# Patient Record
Sex: Male | Born: 1975 | Race: White | Hispanic: No | Marital: Married | Smoking: Former smoker
Health system: Southern US, Community
[De-identification: ages and names within clinical notes are randomized; demographics above are authoritative.]

## PROBLEM LIST (undated history)

## (undated) DIAGNOSIS — M75101 Unspecified rotator cuff tear or rupture of right shoulder, not specified as traumatic: Secondary | ICD-10-CM

## (undated) DIAGNOSIS — E7800 Pure hypercholesterolemia, unspecified: Secondary | ICD-10-CM

## (undated) DIAGNOSIS — K219 Gastro-esophageal reflux disease without esophagitis: Secondary | ICD-10-CM

## (undated) HISTORY — DX: Pure hypercholesterolemia, unspecified: E78.00

## (undated) HISTORY — DX: Gastro-esophageal reflux disease without esophagitis: K21.9

## (undated) HISTORY — PX: VASECTOMY: SHX75

---

## 2010-05-31 ENCOUNTER — Ambulatory Visit (HOSPITAL_COMMUNITY)
Admission: RE | Admit: 2010-05-31 | Discharge: 2010-05-31 | Payer: Self-pay | Source: Home / Self Care | Attending: Chiropractic Medicine | Admitting: Chiropractic Medicine

## 2010-06-23 HISTORY — PX: LUMBAR LAMINECTOMY: SHX95

## 2010-06-24 ENCOUNTER — Observation Stay (HOSPITAL_COMMUNITY)
Admission: RE | Admit: 2010-06-24 | Discharge: 2010-06-25 | Disposition: A | Discharge status: Home / Self Care | Payer: No Typology Code available for payment source | Source: Ambulatory Visit | Attending: Specialist | Admitting: Specialist

## 2010-06-24 ENCOUNTER — Ambulatory Visit (HOSPITAL_COMMUNITY): Payer: No Typology Code available for payment source

## 2010-06-24 DIAGNOSIS — K219 Gastro-esophageal reflux disease without esophagitis: Secondary | ICD-10-CM | POA: Insufficient documentation

## 2010-06-24 DIAGNOSIS — M5126 Other intervertebral disc displacement, lumbar region: Principal | ICD-10-CM | POA: Insufficient documentation

## 2010-06-24 LAB — URINALYSIS, ROUTINE W REFLEX MICROSCOPIC
Bilirubin Urine: NEGATIVE
Ketones, ur: NEGATIVE mg/dL
Nitrite: NEGATIVE
Specific Gravity, Urine: 1.02 (ref 1.005–1.030)
Urobilinogen, UA: 0.2 mg/dL (ref 0.0–1.0)

## 2010-06-24 LAB — COMPREHENSIVE METABOLIC PANEL
Alkaline Phosphatase: 37 U/L — ABNORMAL LOW (ref 39–117)
BUN: 9 mg/dL (ref 6–23)
CO2: 28 mEq/L (ref 19–32)
Chloride: 104 mEq/L (ref 96–112)
GFR calc non Af Amer: >60 mL/min (ref >60)
Glucose, Bld: 96 mg/dL (ref 70–99)
Potassium: 3.9 mEq/L (ref 3.5–5.1)
Total Bilirubin: 1.1 mg/dL (ref 0.3–1.2)

## 2010-06-24 LAB — CBC
MCH: 31.3 pg (ref 26.0–34.0)
MCV: 88.4 fL (ref 78.0–100.0)
Platelets: 212 10*3/uL (ref 150–400)
RDW: 12.4 % (ref 11.5–15.5)
WBC: 4.2 10*3/uL (ref 4.0–10.5)

## 2010-07-03 NOTE — Op Note (Signed)
NAMEGILLIE, Richard Spencer               ACCOUNT NO.:  [id number]  MEDICAL RECORD NO.:  [id number]           PATIENT TYPE:  O  LOCATION:  DAYL                         FACILITY:  [facility]  PHYSICIAN:  Jene Every, M.D.    DATE OF BIRTH:  06/10/1975  DATE OF PROCEDURE: DATE OF DISCHARGE:                              OPERATIVE REPORT   PREOPERATIVE DIAGNOSIS:  Spinal stenosis, herniated nucleus pulposus L4- 5 left.  POSTOPERATIVE DIAGNOSIS:  Spinal stenosis, herniated nucleus pulposus L4- 5 left.  PROCEDURES PERFORMED: 1. Lateral recess decompression L4-5. 2. Microdiskectomy L4-5. 3. Foraminotomy L5.  ANESTHESIA:  General.  ASSISTANT:  Roma Schanz, P.A.  BRIEF HISTORY:  A 35 year old with left lower extremity radicular pain, disk herniation compressing the 5 root indicated for decompression of the 5 root.  Risks and benefits discussed including bleeding, infection, damage to vascular structures, no change in symptoms, worsening symptoms, need for repeat debridement, DVT, PE, anesthetic complications, etc.  TECHNIQUE:  Placed in supine position.  After induction of adequate anesthesia and 1 gram Kefzol, he was placed prone on the [place] frame. All bony prominences were well-padded.  Lumbar region was prepped and draped in the usual sterile fashion.  An 18-gauge spinal needle was utilized to localize the L4-5 interspace, confirmed with x-ray. Incision was made from the spinous process of 4 to 5.  Subcutaneous tissue was dissected.  Electrocautery was utilized to achieve hemostasis.  Dorsolumbar fascia identified and divided in line with skin incision.  Paraspinous muscle elevated from lamina of 4 and 5. Operating microscope draped and brought onto the surgical field. Penfield 4 placed in the interlaminar space confirming 4-5 by x-ray.  Next, straight curette utilized to detach the ligamentum flavum from the cephalad edge of 5.  Hemilaminotomy at the caudad edge of 4  was performed with a 3-mm Kerrison, detaching the ligamentum flavum and preserving the pars.  Neural patty placed beneath the neural elements. Ligamentum flavum removed from the interspace.  A large disk herniation focal compressing the 5 root was noted.  We gently mobilized the 5 root medially after a foraminotomy of 5 was performed.  Focal HNP was noted. Annulotomy performed and a large extruded fragment was expressed.  Three fragments were removed with the micropituitary.  We then entered the disk space and performed diskectomy of herniated material.  We used a nerve hook to mobilized 2 further small fragments.  Following this, there was at least a centimeter of excursion of the 5 root medial to the pedicle without tension.  I swept beneath the thecal sac foramen of 5-4 shoulder of the root.  No evidence of residual disk herniation compressing the 5 root.  Disk space copiously irrigated with antibiotic irrigation.  Inspection revealed no evidence of CSF leakage or active bleeding.  Epidural fat was draped across and around the 5 nerve root.  Final x-ray was unavailable due to the unavailability of the digital portable x-ray machine, which was nonfunctional today.  I felt, given the localization noted previously and the large HNP noted consistent with that seen on the MRI, this was not necessary at this point.  Next, the  dorsolumbar fascia reapproximated with 0 Vicryl interrupted figure-of-8 sutures.  Subcutaneous with 2-0 Vicryl simple sutures.  Skin was reapproximated with 4-0 subcuticular Prolene.  The wound was reinforced with Steri-Strips.  Sterile dressing was applied.  Placed supine on the hospital bed, extubated without difficulty and transported to the recovery room in satisfactory condition.  The patient tolerated the procedure well.  There were no complications.  ESTIMATED BLOOD LOSS:  Minimal.     Jene Every, M.D.     Cordelia Pen  D:  06/24/2010  T:  06/24/2010   Job:  [id number]  Electronically Signed by Jene Every M.D. on 06/28/2010 02:19:35 PM

## 2010-09-27 NOTE — Discharge Summary (Signed)
  NAMEJALIK, Richard Spencer               ACCOUNT NO.:  [id number]  MEDICAL RECORD NO.:  [id number]           PATIENT TYPE:  I  LOCATION:  1609                         FACILITY:  [facility]  PHYSICIAN:  Jene Every, M.D.    DATE OF BIRTH:  April 03, 1976  DATE OF ADMISSION:  06/24/2010 DATE OF DISCHARGE:  06/25/2010                              DISCHARGE SUMMARY   ADMISSION DIAGNOSIS:  Spinal stenosis and herniated nucleus pulposus, L4- L5 on the left.  DISCHARGE DIAGNOSIS:  Spinal stenosis and herniated nucleus pulposus, L4- L5 on the left, status post lumbar decompression and microdiskectomy at L4-L5.  HOSPITAL COURSE:  Uncomplicated.  DISPOSITION:  The patient stable to be discharged home on postop day #1. He is to follow up with Dr. Shelle Iron in approximately 10-14 days from his surgery.  ACTIVITIES:  To ambulate as tolerated utilizing back precautions.  DIET:  As tolerated.  CONDITION ON DISCHARGE:  Stable.  MEDICATIONS:  As per med rec sheet.  FINAL DIAGNOSIS:  Doing well status post lumbar decompression at L4-L5 on the left.     Roma Schanz, P.A.   ______________________________ Jene Every, M.D.    CS/MEDQ  D:  09/07/2010  T:  09/08/2010  Job:  [id number]  Electronically Signed by Roma Schanz P.A. on 09/14/2010 05:53:31 PM Electronically Signed by Jene Every M.D. on 09/27/2010 02:23:17 PM

## 2011-08-23 ENCOUNTER — Encounter: Payer: Self-pay | Admitting: Gastroenterology

## 2011-09-16 ENCOUNTER — Ambulatory Visit: Payer: No Typology Code available for payment source | Admitting: Internal Medicine

## 2011-09-19 ENCOUNTER — Encounter: Payer: Self-pay | Admitting: Gastroenterology

## 2011-09-19 ENCOUNTER — Ambulatory Visit (INDEPENDENT_AMBULATORY_CARE_PROVIDER_SITE_OTHER): Payer: No Typology Code available for payment source | Admitting: Gastroenterology

## 2011-09-19 ENCOUNTER — Other Ambulatory Visit (INDEPENDENT_AMBULATORY_CARE_PROVIDER_SITE_OTHER): Payer: No Typology Code available for payment source

## 2011-09-19 VITALS — BP 110/68 | HR 68 | Ht 66.0 in | Wt 150.0 lb

## 2011-09-19 DIAGNOSIS — R1013 Epigastric pain: Secondary | ICD-10-CM

## 2011-09-19 DIAGNOSIS — R079 Chest pain, unspecified: Secondary | ICD-10-CM

## 2011-09-19 LAB — CBC WITH DIFFERENTIAL/PLATELET
Basophils Relative: 0.4 % (ref 0.0–3.0)
Eosinophils Absolute: 0.1 10*3/uL (ref 0.0–0.7)
MCHC: 34.5 g/dL (ref 30.0–36.0)
MCV: 92.3 fl (ref 78.0–100.0)
Monocytes Absolute: 0.4 10*3/uL (ref 0.1–1.0)
Neutrophils Relative %: 63.2 % (ref 43.0–77.0)
RBC: 4.63 Mil/uL (ref 4.22–5.81)

## 2011-09-19 LAB — COMPREHENSIVE METABOLIC PANEL
ALT: 27 U/L (ref 0–53)
AST: 22 U/L (ref 0–37)
Albumin: 4 g/dL (ref 3.5–5.2)
Alkaline Phosphatase: 35 U/L — ABNORMAL LOW (ref 39–117)
BUN: 12 mg/dL (ref 6–23)
Potassium: 4 mEq/L (ref 3.5–5.1)
Sodium: 142 mEq/L (ref 135–145)
Total Protein: 6.6 g/dL (ref 6.0–8.3)

## 2011-09-19 NOTE — Progress Notes (Signed)
History of Present Illness: This is a 36 year old male who relates problems with recurrent right upper quadrant and epigastric pain for the past few months. His symptoms are episodic. One episode in early May was quite severe pain in the right upper quadrant epigastrium right lower chest and left shoulder. And lasted for 8 or 9 hours. He has had mild episodes of epigastric right upper quadrant and right lower chest pain some associated with gas and bloating since then. He has had long-term GERD treated with medications since the 1990s. He is currently treated with omeprazole 20 mg daily. Denies weight loss, constipation, diarrhea, change in stool caliber, melena, hematochezia, nausea, vomiting, dysphagia  Current Medications, Allergies, Past Medical History, Past Surgical History, Family History and Social History were reviewed in [employer] record.  Physical Exam: General: Well developed , well nourished, no acute distress Head: Normocephalic and atraumatic Eyes:  sclerae anicteric, EOMI Ears: Normal auditory acuity Mouth: No deformity or lesions Lungs: Clear throughout to auscultation, no chest wall tenderness Heart: Regular rate and rhythm; no murmurs, rubs or bruits Abdomen: Soft, non tender and non distended. No masses, hepatosplenomegaly or hernias noted. Normal Bowel sounds Musculoskeletal: Symmetrical with no gross deformities  Pulses:  Normal pulses noted Extremities: No clubbing, cyanosis, edema or deformities noted Neurological: Alert oriented x 4, grossly nonfocal Psychological:  Alert and cooperative. Normal mood and affect  Assessment and Recommendations:  1. Episodic epigastric, right upper quadrant and right lower chest pain. One episode was associated with left shoulder pain. Rule out GERD, gastritis, ulcer disease, cholelithiasis. Obtain blood work and schedule an abdominal ultrasound. If no diagnosis is established will proceed with upper endoscopy. The  risks, benefits, and alternatives to endoscopy with possible biopsy and possible dilation were discussed with the patient and they consent to proceed.

## 2011-09-19 NOTE — Patient Instructions (Addendum)
You have been given a separate informational sheet regarding your tobacco use, the importance of quitting and local resources to help you quit.  Your physician has requested that you go to the basement for the following lab work before leaving today:CBC, Cmet.  You have been scheduled for an abdominal ultrasound at [facility] (1st floor of hospital) on 09/21/11 at 8:30am. Please arrive 15 minutes prior to your appointment for registration. Make certain not to have anything to eat or drink 6 hours prior to your appointment. Should you need to reschedule your appointment, please contact radiology at [phone].  cc: Blanche East, MD

## 2011-09-21 ENCOUNTER — Ambulatory Visit (HOSPITAL_COMMUNITY)
Admission: RE | Admit: 2011-09-21 | Discharge: 2011-09-21 | Disposition: A | Discharge status: Home / Self Care | Payer: No Typology Code available for payment source | Source: Ambulatory Visit | Attending: Gastroenterology | Admitting: Gastroenterology

## 2011-09-21 DIAGNOSIS — R079 Chest pain, unspecified: Secondary | ICD-10-CM | POA: Insufficient documentation

## 2011-09-21 DIAGNOSIS — R1013 Epigastric pain: Secondary | ICD-10-CM | POA: Insufficient documentation

## 2011-10-24 ENCOUNTER — Encounter: Payer: Self-pay | Admitting: Gastroenterology

## 2011-10-24 ENCOUNTER — Ambulatory Visit (INDEPENDENT_AMBULATORY_CARE_PROVIDER_SITE_OTHER): Payer: No Typology Code available for payment source | Admitting: Gastroenterology

## 2011-10-24 VITALS — BP 110/60 | HR 88 | Ht 66.0 in | Wt 144.0 lb

## 2011-10-24 DIAGNOSIS — R109 Unspecified abdominal pain: Secondary | ICD-10-CM

## 2011-10-24 DIAGNOSIS — K219 Gastro-esophageal reflux disease without esophagitis: Secondary | ICD-10-CM

## 2011-10-24 NOTE — Patient Instructions (Signed)
You have been given an anti-Reflux handout.  Follow up as needed

## 2011-10-24 NOTE — Progress Notes (Signed)
History of Present Illness: This is a 36 year old male who had had resolution of his upper abd pain. [place] and blood work was all normal. He GERD is well controlled on daily omeprazole. He noted a few episodes on mild, brief duration lower abd pain, not associated with meals or BMs.  Current Medications, Allergies, Past Medical History, Past Surgical History, Family History and Social History were reviewed in [employer] record.  Physical Exam: General: Well developed , well nourished, no acute distress Head: Normocephalic and atraumatic Eyes:  sclerae anicteric, EOMI Ears: Normal auditory acuity Mouth: No deformity or lesions Lungs: Clear throughout to auscultation Heart: Regular rate and rhythm; no murmurs, rubs or bruits Abdomen: Soft, non tender and non distended. No masses, hepatosplenomegaly or hernias noted. Normal Bowel sounds Musculoskeletal: Symmetrical with no gross deformities  Extremities: No clubbing, cyanosis, edema or deformities noted Neurological: Alert oriented x 4, grossly nonfocal Psychological:  Alert and cooperative. Normal mood and affect  Assessment and Recommendations:  1. GERD. Continue omeprazole daily and standard antireflux measures.   2. Upper abd pain resolved. Possibly muscoloskeletal.  3. Mild lower abd pain. Monitor for now. If it worsens will try an anti-spasmotic and he is advised to return for follow up.

## 2013-06-18 ENCOUNTER — Ambulatory Visit: Payer: Self-pay | Admitting: Specialist

## 2013-07-25 ENCOUNTER — Other Ambulatory Visit: Payer: Self-pay | Admitting: Orthopedic Surgery

## 2013-07-26 ENCOUNTER — Other Ambulatory Visit: Payer: Self-pay | Admitting: Orthopedic Surgery

## 2013-07-26 NOTE — H&P (Signed)
Richard Spencer is an 38 y.o. male.   Chief Complaint: shoulder pain HPI: The patient is a 38 year old male who presents today for follow up of their shoulder. The patient is being followed for their bilateral shoulder pain. They are 2 year(s) out from when symptoms began. Symptoms reported today include: pain. and report their pain level to be moderate to severe. Current treatment includes: NSAIDs (Ibuprofen). The patient presents today following MRI.  Richard Spencer follows up with bilateral shoulder pain, both sides are fairly severe.  Past Medical History  Diagnosis Date  . Hypercholesterolemia   . Allergic rhinitis   . GERD (gastroesophageal reflux disease)     Past Surgical History  Procedure Laterality Date  . Lumbar laminectomy  06/2010    Family History  Problem Relation Age of Onset  . Prostate cancer Father   . Coronary artery disease Father   . Heart attack Father   . Heart disease Father   . Hypertension Mother   . Rheum arthritis Maternal Grandmother   . Colon cancer Neg Hx    Social History:  reports that he has never smoked. His smokeless tobacco use includes Snuff. He reports that he drinks alcohol. He reports that he does not use illicit drugs.  Allergies:  Allergies  Allergen Reactions  . Erythromycin Other (See Comments)    GI upset     (Not in a hospital admission)  No results found for this or any previous visit (from the past 48 hour(s)). No results found.  Review of Systems  Constitutional: Negative.   HENT: Negative.   Eyes: Negative.   Respiratory: Negative.   Cardiovascular: Negative.   Gastrointestinal: Negative.   Genitourinary: Negative.   Musculoskeletal: Positive for joint pain.  Skin: Negative.   Neurological: Negative.   Endo/Heme/Allergies: Negative.   Psychiatric/Behavioral: Negative.     There were no vitals taken for this visit. Physical Exam  Constitutional: He is oriented to person, place, and time. He appears  well-developed and well-nourished.  HENT:  Head: Normocephalic and atraumatic.  Eyes: Conjunctivae and EOM are normal. Pupils are equal, round, and reactive to light.  Neck: Normal range of motion. Neck supple.  Cardiovascular: Normal rate and regular rhythm.   Respiratory: Effort normal and breath sounds normal.  GI: Soft. Bowel sounds are normal.  Musculoskeletal:  Examination of the left, he has a positive impingement sign.  Inspection of the shoulder revealed no ecchymosis, soft tissue swelling, or deformity. On palpation, nontender over the [facility] and in the subacromial region. On range of motion the patient had full range of motion. Provocative signs indicated no sulcus sign, negative speed's test. Negative lift off. Sensory exam was intact and motor function was normal in the deltoid and the rotator cuff.  On the right, positive impingement sign, positive secondary impingement sign. Tender in the anterior subacromial region. Some weakness in abduction.  Inspection of the shoulder revealed no ecchymosis, soft tissue swelling, or deformity. On palpation, nontender over the [facility]. Provocative signs indicated no sulcus sign, negative speed's test. Negative lift off. Sensory exam was intact and motor function was normal in the deltoid and the rotator cuff.  Neurological: He is alert and oriented to person, place, and time. He has normal reflexes.  Skin: Skin is warm and dry.  Psychiatric: He has a normal mood and affect.    On the left MRI indicates some bursitis, tendonitis, rotator cuff arthropathy. No definitive rotator cuff tear.  On the right he has greater  than 50% interstitial tear of the supraspinatus at its insertion. Mild to moderate AC arthrosis, anterior and inferior glenoid labral tear, chronic, nontender.   Assessment/Plan R shoulder RCT Persistent shoulder pain secondary to partial tear rotator cuff greater than 50% thickness with underlying labral tear, not symptomatic.  Pain is mainly abduction, overhead activities. This has been refractory to rest, activity modifications, home exercise program, two years in duration.  Discussed options bilaterally, for the left shoulder arthroscopy, for the right arthroscopy, subacromial decompression, and repair of partial tear, it is greater than 50%, and with his continued symptoms that would be one option for him versus living with his symptoms and expected management. Discussed avoiding a full thickness tear and retracted tendon. The patient would like to proceed with the surgery. Discussed time out of work. 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 He should be able to accelerate his recovery. We will proceed accordingly.  I had a long discussion with the patient concerning risks and benefits of shoulder arthroscopy including no changes, worsening in symptoms. Also the need for manipulation of the extremity, need for open rotator cuff repair. Also discussed infection, DVT, PE, anesthetic complications, etc. Also included was the possibility of requirement for a repeat debridement in the future. Perioperative course was discussed in detail as well as time to recovery. An illustrated handout was provided and discussed in detail.  Plan Right shoulder arthroscopy, SAD, mini-open RCR  BISSELL, JACLYN M. 07/26/2013, 3:12 PM

## 2013-07-31 ENCOUNTER — Encounter (HOSPITAL_COMMUNITY): Payer: Self-pay | Admitting: Pharmacy Technician

## 2013-08-01 ENCOUNTER — Other Ambulatory Visit (HOSPITAL_COMMUNITY): Payer: Self-pay | Admitting: Specialist

## 2013-08-01 NOTE — Patient Instructions (Addendum)
      Your procedure is scheduled on:  08/08/13 THURSDAY  Report to [facility] at   0800    AM.  Call this number if you have problems the morning of surgery: [phone]        Do not eat food  Or drink :After Midnight. Wednesday NIGHT   Take these medicines the morning of surgery with A SIP OF WATER: OMEPRAZOLE   .  Contacts, dentures or partial plates, or metal hairpins  can not be worn to surgery. Your family will be responsible for glasses, dentures, hearing aides while you are in surgery  Leave suitcase in the car. After surgery it may be brought to your room.  For patients admitted to the hospital, checkout time is 11:00 AM day of  discharge.                DO NOT WEAR JEWELRY, LOTIONS, POWDERS, OR PERFUMES.  WOMEN-- DO NOT SHAVE LEGS OR UNDERARMS FOR 48 HOURS BEFORE SHOWERS. MEN MAY SHAVE FACE.  Patients discharged the day of surgery will not be allowed to drive home. IF going home the day of surgery, you must have a driver and someone to stay with you for the first 24 hours  Name and phone number of your driver: overnight stay-  [place] wife                                                                                           FAILURE TO [place]                                                  Patient Signature _____________________________

## 2013-08-05 ENCOUNTER — Encounter (HOSPITAL_COMMUNITY): Payer: Self-pay

## 2013-08-05 ENCOUNTER — Encounter (HOSPITAL_COMMUNITY)
Admission: RE | Admit: 2013-08-05 | Discharge: 2013-08-05 | Disposition: A | Discharge status: Home / Self Care | Payer: No Typology Code available for payment source | Source: Ambulatory Visit | Attending: Specialist | Admitting: Specialist

## 2013-08-05 HISTORY — DX: Unspecified rotator cuff tear or rupture of right shoulder, not specified as traumatic: M75.101

## 2013-08-05 LAB — CBC
HCT: 44.7 % (ref 39.0–52.0)
Hemoglobin: 15.8 g/dL (ref 13.0–17.0)
MCH: 31.8 pg (ref 26.0–34.0)
MCHC: 35.3 g/dL (ref 30.0–36.0)
MCV: 89.9 fL (ref 78.0–100.0)
Platelets: 259 10*3/uL (ref 150–400)
RBC: 4.97 MIL/uL (ref 4.22–5.81)
RDW: 12.7 % (ref 11.5–15.5)
WBC: 5.7 10*3/uL (ref 4.0–10.5)

## 2013-08-05 LAB — COMPREHENSIVE METABOLIC PANEL
ALT: 24 U/L (ref 0–53)
AST: 19 U/L (ref 0–37)
Albumin: 4.5 g/dL (ref 3.5–5.2)
Alkaline Phosphatase: 49 U/L (ref 39–117)
BILIRUBIN TOTAL: 0.6 mg/dL (ref 0.3–1.2)
BUN: 11 mg/dL (ref 6–23)
CHLORIDE: 98 meq/L (ref 96–112)
CO2: 28 meq/L (ref 19–32)
CREATININE: 1.11 mg/dL (ref 0.50–1.35)
Calcium: 10 mg/dL (ref 8.4–10.5)
GFR, EST NON AFRICAN AMERICAN: 82 mL/min — AB (ref >90)
GLUCOSE: 96 mg/dL (ref 70–99)
Potassium: 4.8 mEq/L (ref 3.7–5.3)
Sodium: 137 mEq/L (ref 137–147)
Total Protein: 7.6 g/dL (ref 6.0–8.3)

## 2013-08-05 NOTE — Progress Notes (Signed)
Instructed no alcohol at least 48 hrs pre op. Verbalized understanding

## 2013-08-05 NOTE — Progress Notes (Signed)
Instructed patient that NPO after midnight means NO SNUFF AS WELL. Stated understood this

## 2013-08-08 ENCOUNTER — Ambulatory Visit (HOSPITAL_COMMUNITY): Payer: No Typology Code available for payment source | Admitting: Anesthesiology

## 2013-08-08 ENCOUNTER — Encounter (HOSPITAL_COMMUNITY): Payer: No Typology Code available for payment source | Admitting: Anesthesiology

## 2013-08-08 ENCOUNTER — Ambulatory Visit (HOSPITAL_COMMUNITY)
Admission: RE | Admit: 2013-08-08 | Discharge: 2013-08-08 | Disposition: A | Discharge status: Home / Self Care | Payer: No Typology Code available for payment source | Source: Ambulatory Visit | Attending: Specialist | Admitting: Specialist

## 2013-08-08 ENCOUNTER — Encounter (HOSPITAL_COMMUNITY)
Admission: RE | Disposition: A | Discharge status: Home / Self Care | Payer: Self-pay | Source: Ambulatory Visit | Attending: Specialist

## 2013-08-08 ENCOUNTER — Encounter (HOSPITAL_COMMUNITY): Payer: Self-pay | Admitting: *Deleted

## 2013-08-08 DIAGNOSIS — M67919 Unspecified disorder of synovium and tendon, unspecified shoulder: Secondary | ICD-10-CM | POA: Insufficient documentation

## 2013-08-08 DIAGNOSIS — M7580 Other shoulder lesions, unspecified shoulder: Principal | ICD-10-CM

## 2013-08-08 DIAGNOSIS — Z5333 Arthroscopic surgical procedure converted to open procedure: Secondary | ICD-10-CM | POA: Insufficient documentation

## 2013-08-08 DIAGNOSIS — Z79899 Other long term (current) drug therapy: Secondary | ICD-10-CM | POA: Insufficient documentation

## 2013-08-08 DIAGNOSIS — X58XXXA Exposure to other specified factors, initial encounter: Secondary | ICD-10-CM | POA: Insufficient documentation

## 2013-08-08 DIAGNOSIS — J309 Allergic rhinitis, unspecified: Secondary | ICD-10-CM | POA: Insufficient documentation

## 2013-08-08 DIAGNOSIS — E7800 Pure hypercholesterolemia, unspecified: Secondary | ICD-10-CM | POA: Insufficient documentation

## 2013-08-08 DIAGNOSIS — Z87891 Personal history of nicotine dependence: Secondary | ICD-10-CM | POA: Insufficient documentation

## 2013-08-08 DIAGNOSIS — M719 Bursopathy, unspecified: Secondary | ICD-10-CM | POA: Insufficient documentation

## 2013-08-08 DIAGNOSIS — M898X9 Other specified disorders of bone, unspecified site: Secondary | ICD-10-CM | POA: Insufficient documentation

## 2013-08-08 DIAGNOSIS — M25819 Other specified joint disorders, unspecified shoulder: Secondary | ICD-10-CM | POA: Insufficient documentation

## 2013-08-08 DIAGNOSIS — M75101 Unspecified rotator cuff tear or rupture of right shoulder, not specified as traumatic: Secondary | ICD-10-CM

## 2013-08-08 DIAGNOSIS — S43429A Sprain of unspecified rotator cuff capsule, initial encounter: Secondary | ICD-10-CM | POA: Insufficient documentation

## 2013-08-08 DIAGNOSIS — M6580 Other synovitis and tenosynovitis, unspecified site: Secondary | ICD-10-CM | POA: Insufficient documentation

## 2013-08-08 DIAGNOSIS — K219 Gastro-esophageal reflux disease without esophagitis: Secondary | ICD-10-CM | POA: Insufficient documentation

## 2013-08-08 HISTORY — PX: SHOULDER ARTHROSCOPY WITH OPEN ROTATOR CUFF REPAIR: SHX6092

## 2013-08-08 SURGERY — ARTHROSCOPY, SHOULDER WITH REPAIR, ROTATOR CUFF, OPEN
Anesthesia: General | Site: Shoulder | Laterality: Right

## 2013-08-08 MED ORDER — ROCURONIUM BROMIDE 100 MG/10ML IV SOLN
INTRAVENOUS | Status: AC
  Filled 2013-08-08: qty 1

## 2013-08-08 MED ORDER — OMEPRAZOLE MAGNESIUM 20 MG PO TBEC: 20.0000 mg | DELAYED_RELEASE_TABLET | Freq: Every day | ORAL | Status: DC

## 2013-08-08 MED ORDER — METOCLOPRAMIDE HCL 5 MG/ML IJ SOLN: 5.0000 mg | Freq: Three times a day (TID) | INTRAMUSCULAR | Status: DC | PRN

## 2013-08-08 MED ORDER — PHENYLEPHRINE HCL 10 MG/ML IJ SOLN
INTRAMUSCULAR | Status: DC | PRN
  Administered 2013-08-08: 40 ug via INTRAVENOUS

## 2013-08-08 MED ORDER — ROCURONIUM BROMIDE 100 MG/10ML IV SOLN
INTRAVENOUS | Status: DC | PRN
  Administered 2013-08-08: 45 mg via INTRAVENOUS

## 2013-08-08 MED ORDER — ONDANSETRON HCL 4 MG/2ML IJ SOLN
INTRAMUSCULAR | Status: DC | PRN
  Administered 2013-08-08: 4 mg via INTRAVENOUS

## 2013-08-08 MED ORDER — PHENOL 1.4 % MT LIQD: 1.0000 | OROMUCOSAL | Status: DC | PRN

## 2013-08-08 MED ORDER — METHOCARBAMOL 500 MG PO TABS: 500.0000 mg | ORAL_TABLET | Freq: Four times a day (QID) | ORAL | Status: DC | PRN

## 2013-08-08 MED ORDER — ACETAMINOPHEN 325 MG PO TABS: 650.0000 mg | ORAL_TABLET | Freq: Four times a day (QID) | ORAL | Status: DC | PRN

## 2013-08-08 MED ORDER — SODIUM CHLORIDE 0.45 % IV SOLN: INTRAVENOUS | Status: DC

## 2013-08-08 MED ORDER — HYDROCODONE-ACETAMINOPHEN 5-325 MG PO TABS: 1.0000 | ORAL_TABLET | ORAL | Status: DC | PRN

## 2013-08-08 MED ORDER — LACTATED RINGERS IR SOLN
Status: DC | PRN
  Administered 2013-08-08 (×2): 1000 mL

## 2013-08-08 MED ORDER — EPINEPHRINE HCL 1 MG/ML IJ SOLN
INTRAMUSCULAR | Status: AC
  Filled 2013-08-08: qty 2

## 2013-08-08 MED ORDER — LACTATED RINGERS IV SOLN
INTRAVENOUS | Status: DC
  Administered 2013-08-08: 1000 mL via INTRAVENOUS

## 2013-08-08 MED ORDER — PROPOFOL 10 MG/ML IV BOLUS
INTRAVENOUS | Status: DC | PRN
  Administered 2013-08-08: 200 mg via INTRAVENOUS

## 2013-08-08 MED ORDER — GLYCOPYRROLATE 0.2 MG/ML IJ SOLN
INTRAMUSCULAR | Status: AC
  Filled 2013-08-08: qty 2

## 2013-08-08 MED ORDER — MENTHOL 3 MG MT LOZG
1.0000 | LOZENGE | OROMUCOSAL | Status: DC | PRN
  Filled 2013-08-08: qty 9

## 2013-08-08 MED ORDER — GLYCOPYRROLATE 0.2 MG/ML IJ SOLN
INTRAMUSCULAR | Status: DC | PRN
  Administered 2013-08-08: 0.4 mg via INTRAVENOUS

## 2013-08-08 MED ORDER — FENTANYL CITRATE 0.05 MG/ML IJ SOLN
INTRAMUSCULAR | Status: DC | PRN
  Administered 2013-08-08: 100 ug via INTRAVENOUS
  Administered 2013-08-08: 50 ug via INTRAVENOUS

## 2013-08-08 MED ORDER — MIDAZOLAM HCL 2 MG/2ML IJ SOLN
INTRAMUSCULAR | Status: AC
  Filled 2013-08-08: qty 2

## 2013-08-08 MED ORDER — CEFAZOLIN SODIUM 1-5 GM-% IV SOLN
1.0000 g | Freq: Four times a day (QID) | INTRAVENOUS | Status: DC
  Filled 2013-08-08 (×2): qty 50

## 2013-08-08 MED ORDER — IBUPROFEN 200 MG PO TABS: 400.0000 mg | ORAL_TABLET | Freq: Four times a day (QID) | ORAL | Status: AC | PRN

## 2013-08-08 MED ORDER — OXYCODONE-ACETAMINOPHEN 5-325 MG PO TABS
1.0000 | ORAL_TABLET | ORAL | Status: DC | PRN
  Administered 2013-08-08: 1 via ORAL
  Filled 2013-08-08: qty 1

## 2013-08-08 MED ORDER — CEFAZOLIN SODIUM-DEXTROSE 2-3 GM-% IV SOLR
INTRAVENOUS | Status: AC
  Filled 2013-08-08: qty 50

## 2013-08-08 MED ORDER — METHOCARBAMOL 100 MG/ML IJ SOLN
500.0000 mg | Freq: Four times a day (QID) | INTRAVENOUS | Status: DC | PRN
  Administered 2013-08-08: 500 mg via INTRAVENOUS
  Filled 2013-08-08: qty 5

## 2013-08-08 MED ORDER — ONDANSETRON HCL 4 MG PO TABS: 4.0000 mg | ORAL_TABLET | Freq: Four times a day (QID) | ORAL | Status: DC | PRN

## 2013-08-08 MED ORDER — METHOCARBAMOL 500 MG PO TABS: 500.0000 mg | ORAL_TABLET | Freq: Three times a day (TID) | ORAL | Status: DC

## 2013-08-08 MED ORDER — HYDROMORPHONE HCL PF 1 MG/ML IJ SOLN
INTRAMUSCULAR | Status: AC
  Filled 2013-08-08: qty 1

## 2013-08-08 MED ORDER — NEOSTIGMINE METHYLSULFATE 1 MG/ML IJ SOLN
INTRAMUSCULAR | Status: AC
  Filled 2013-08-08: qty 10

## 2013-08-08 MED ORDER — ONDANSETRON HCL 4 MG/2ML IJ SOLN
INTRAMUSCULAR | Status: AC
  Filled 2013-08-08: qty 2

## 2013-08-08 MED ORDER — METOCLOPRAMIDE HCL 10 MG PO TABS: 5.0000 mg | ORAL_TABLET | Freq: Three times a day (TID) | ORAL | Status: DC | PRN

## 2013-08-08 MED ORDER — LIDOCAINE HCL (CARDIAC) 20 MG/ML IV SOLN
INTRAVENOUS | Status: DC | PRN
  Administered 2013-08-08: 50 mg via INTRAVENOUS

## 2013-08-08 MED ORDER — OXYCODONE-ACETAMINOPHEN 5-325 MG PO TABS: 1.0000 | ORAL_TABLET | ORAL | Status: DC | PRN

## 2013-08-08 MED ORDER — HYDROMORPHONE HCL PF 1 MG/ML IJ SOLN
0.2500 mg | INTRAMUSCULAR | Status: DC | PRN
Start: 2013-08-08 — End: 2013-08-08
  Administered 2013-08-08: 0.25 mg via INTRAVENOUS
  Administered 2013-08-08 (×2): 0.5 mg via INTRAVENOUS

## 2013-08-08 MED ORDER — PROPOFOL 10 MG/ML IV BOLUS
INTRAVENOUS | Status: AC
  Filled 2013-08-08: qty 20

## 2013-08-08 MED ORDER — HYDROMORPHONE HCL PF 1 MG/ML IJ SOLN
INTRAMUSCULAR | Status: DC
Start: 2013-08-08 — End: 2013-08-08
  Filled 2013-08-08: qty 1

## 2013-08-08 MED ORDER — ACETAMINOPHEN 650 MG RE SUPP: 650.0000 mg | Freq: Four times a day (QID) | RECTAL | Status: DC | PRN

## 2013-08-08 MED ORDER — GLYCOPYRROLATE 0.2 MG/ML IJ SOLN
INTRAMUSCULAR | Status: AC
  Filled 2013-08-08: qty 4

## 2013-08-08 MED ORDER — KETOROLAC TROMETHAMINE 30 MG/ML IJ SOLN
INTRAMUSCULAR | Status: AC
  Filled 2013-08-08: qty 1

## 2013-08-08 MED ORDER — HYDROMORPHONE HCL PF 1 MG/ML IJ SOLN: 0.5000 mg | INTRAMUSCULAR | Status: DC | PRN

## 2013-08-08 MED ORDER — ONDANSETRON HCL 4 MG/2ML IJ SOLN: 4.0000 mg | Freq: Four times a day (QID) | INTRAMUSCULAR | Status: DC | PRN

## 2013-08-08 MED ORDER — KETOROLAC TROMETHAMINE 10 MG PO TABS: 10.0000 mg | ORAL_TABLET | Freq: Four times a day (QID) | ORAL | Status: DC | PRN

## 2013-08-08 MED ORDER — ENOXAPARIN SODIUM 40 MG/0.4ML ~~LOC~~ SOLN
40.0000 mg | SUBCUTANEOUS | Status: DC
  Filled 2013-08-08: qty 0.4

## 2013-08-08 MED ORDER — LIDOCAINE HCL (CARDIAC) 20 MG/ML IV SOLN
INTRAVENOUS | Status: AC
  Filled 2013-08-08: qty 5

## 2013-08-08 MED ORDER — EPINEPHRINE HCL 1 MG/ML IJ SOLN
INTRAMUSCULAR | Status: DC | PRN
  Administered 2013-08-08 (×2): 1 mg

## 2013-08-08 MED ORDER — BUPIVACAINE-EPINEPHRINE 0.5% -1:200000 IJ SOLN
INTRAMUSCULAR | Status: DC | PRN
  Administered 2013-08-08: 30 mL

## 2013-08-08 MED ORDER — NEOSTIGMINE METHYLSULFATE 1 MG/ML IJ SOLN
INTRAMUSCULAR | Status: DC | PRN
  Administered 2013-08-08: 3 mg via INTRAVENOUS

## 2013-08-08 MED ORDER — FENTANYL CITRATE 0.05 MG/ML IJ SOLN
INTRAMUSCULAR | Status: AC
  Filled 2013-08-08: qty 5

## 2013-08-08 MED ORDER — KETOROLAC TROMETHAMINE 30 MG/ML IJ SOLN
30.0000 mg | Freq: Four times a day (QID) | INTRAMUSCULAR | Status: DC
  Administered 2013-08-08: 30 mg via INTRAVENOUS
  Filled 2013-08-08 (×3): qty 1

## 2013-08-08 MED ORDER — MIDAZOLAM HCL 5 MG/5ML IJ SOLN
INTRAMUSCULAR | Status: DC | PRN
  Administered 2013-08-08: 2 mg via INTRAVENOUS

## 2013-08-08 MED ORDER — BUPIVACAINE-EPINEPHRINE PF 0.5-1:200000 % IJ SOLN
INTRAMUSCULAR | Status: AC
  Filled 2013-08-08: qty 30

## 2013-08-08 MED ORDER — DOCUSATE SODIUM 100 MG PO CAPS: 100.0000 mg | ORAL_CAPSULE | Freq: Two times a day (BID) | ORAL | Status: DC

## 2013-08-08 MED ORDER — CEFAZOLIN SODIUM-DEXTROSE 2-3 GM-% IV SOLR
2.0000 g | INTRAVENOUS | Status: AC
  Administered 2013-08-08: 2 g via INTRAVENOUS

## 2013-08-08 MED ORDER — LACTATED RINGERS IV SOLN: INTRAVENOUS | Status: DC

## 2013-08-08 SURGICAL SUPPLY — 55 items
ANCHOR NDL 9/16 CIR SZ 8 (NEEDLE) IMPLANT
ANCHOR NEEDLE 9/16 CIR SZ 8 (NEEDLE) IMPLANT
BLADE CUTTER GATOR 3.5 (BLADE) ×3 IMPLANT
BLADE SURG SZ11 CARB STEEL (BLADE) ×3 IMPLANT
BUR OVAL 4.0 (BURR) IMPLANT
CANNULA ACUFO 5X76 (CANNULA) ×3 IMPLANT
CHLORAPREP W/TINT 26ML (MISCELLANEOUS) IMPLANT
CLEANER TIP ELECTROSURG 2X2 (MISCELLANEOUS) IMPLANT
CLOSURE WOUND 1/2 X4 (GAUZE/BANDAGES/DRESSINGS) ×1
CLOTH 2% CHLOROHEXIDINE 3PK (PERSONAL CARE ITEMS) ×3 IMPLANT
DECANTER SPIKE VIAL GLASS SM (MISCELLANEOUS) ×3 IMPLANT
DRAPE ORTHO SPLIT 77X108 STRL (DRAPES)
DRAPE POUCH INSTRU U-SHP 10X18 (DRAPES) IMPLANT
DRAPE STERI 35X30 U-POUCH (DRAPES) ×3 IMPLANT
DRAPE SURG ORHT 6 SPLT 77X108 (DRAPES) IMPLANT
DRSG AQUACEL AG ADV 3.5X 4 (GAUZE/BANDAGES/DRESSINGS) ×3 IMPLANT
DRSG AQUACEL AG ADV 3.5X 6 (GAUZE/BANDAGES/DRESSINGS) ×2 IMPLANT
DRSG EMULSION OIL 3X3 NADH (GAUZE/BANDAGES/DRESSINGS) IMPLANT
DURAPREP 26ML APPLICATOR (WOUND CARE) ×3 IMPLANT
ELECT NDL TIP 2.8 STRL (NEEDLE) ×1 IMPLANT
ELECT NEEDLE TIP 2.8 STRL (NEEDLE) ×3 IMPLANT
GLOVE BIOGEL PI IND STRL 7.5 (GLOVE) ×1 IMPLANT
GLOVE BIOGEL PI INDICATOR 7.5 (GLOVE) ×2
GLOVE SURG SS PI 7.5 STRL IVOR (GLOVE) ×3 IMPLANT
GLOVE SURG SS PI 8.0 STRL IVOR (GLOVE) ×6 IMPLANT
GOWN STRL REUS W/TWL XL LVL3 (GOWN DISPOSABLE) ×6 IMPLANT
IMMOBILIZER SHOULDER SM CANVAS (SOFTGOODS) ×2 IMPLANT
KIT BASIN OR (CUSTOM PROCEDURE TRAY) ×3 IMPLANT
KIT POSITION SHOULDER SCHLEI (MISCELLANEOUS) ×3 IMPLANT
MANIFOLD NEPTUNE II (INSTRUMENTS) ×3 IMPLANT
NDL SCORPION MULTI FIRE (NEEDLE) IMPLANT
NDL SPNL 18GX3.5 QUINCKE PK (NEEDLE) ×1 IMPLANT
NEEDLE SCORPION MULTI FIRE (NEEDLE) IMPLANT
NEEDLE SPNL 18GX3.5 QUINCKE PK (NEEDLE) ×3 IMPLANT
PACK SHOULDER CUSTOM OPM052 (CUSTOM PROCEDURE TRAY) ×3 IMPLANT
POSITIONER SURGICAL ARM (MISCELLANEOUS) ×3 IMPLANT
SET ARTHROSCOPY TUBING (MISCELLANEOUS) ×3
SET ARTHROSCOPY TUBING LN (MISCELLANEOUS) ×1 IMPLANT
SLING ARM IMMOBILIZER LRG (SOFTGOODS) IMPLANT
SLING ARM IMMOBILIZER MED (SOFTGOODS) IMPLANT
SPONGE LAP 4X18 X RAY DECT (DISPOSABLE) IMPLANT
STRIP CLOSURE SKIN 1/2X4 (GAUZE/BANDAGES/DRESSINGS) ×1 IMPLANT
SUT BONE WAX W31G (SUTURE) IMPLANT
SUT ETHILON 3 0 PS 1 (SUTURE) ×2 IMPLANT
SUT ETHILON 4 0 PS 2 18 (SUTURE) ×3 IMPLANT
SUT PROLENE 3 0 PS 2 (SUTURE) IMPLANT
SUT TIGER TAPE 7 IN WHITE (SUTURE) IMPLANT
SUT VIC AB 1-0 CT2 27 (SUTURE) IMPLANT
SUT VIC AB 2-0 CT2 27 (SUTURE) IMPLANT
SUT VICRYL 0 UR6 27IN ABS (SUTURE) IMPLANT
SUT VICRYL 0-0 OS 2 NEEDLE (SUTURE) IMPLANT
TAPE FIBER 2MM 7IN #2 BLUE (SUTURE) IMPLANT
TUBING CONNECTING 10 (TUBING) ×2 IMPLANT
TUBING CONNECTING 10' (TUBING) ×1
WAND 90 DEG TURBOVAC W/CORD (SURGICAL WAND) ×2 IMPLANT

## 2013-08-08 NOTE — Anesthesia Postprocedure Evaluation (Signed)
  Anesthesia Post-op Note  Patient: Richard Spencer  Procedure(s) Performed: Procedure(s) (LRB): RIGHT SHOULDER ARTHROSCOPY WITH MINI OPEN ROTATOR CUFF REPAIR AND SUBACROMIAL DECOMPRESSION  (Right)  Patient Location: PACU  Anesthesia Type: General  Level of Consciousness: awake and alert   Airway and Oxygen Therapy: Patient Spontanous Breathing  Post-op Pain: mild  Post-op Assessment: Post-op Vital signs reviewed, Patient's Cardiovascular Status Stable, Respiratory Function Stable, Patent Airway and No signs of Nausea or vomiting  Last Vitals:  Filed Vitals:   08/08/13 1345  BP: 126/82  Pulse: 64  Temp: 36.7 C  Resp: 12    Post-op Vital Signs: stable   Complications: No apparent anesthesia complications

## 2013-08-08 NOTE — Discharge Summary (Signed)
Physician Discharge Summary   Patient ID: Richard Spencer MRN: [id number] DOB/AGE: Mar 06, 1976 38 y.o.  Admit date: 08/08/2013 Discharge date: 08/08/2013  Primary Diagnosis:   ROTATOR CUFF ON RIGHT  Admission Diagnoses:  Past Medical History  Diagnosis Date  . Hypercholesterolemia   . Allergic rhinitis   . GERD (gastroesophageal reflux disease)   . Rotator cuff tear, right    Discharge Diagnoses:   Active Problems:   Rotator cuff tear, right  Procedure:  Procedure(s) (LRB): RIGHT SHOULDER ARTHROSCOPY WITH MINI OPEN ROTATOR CUFF REPAIR AND SUBACROMIAL DECOMPRESSION  (Right)   Consults: None  HPI:  See H&P    Laboratory Data: Hospital Outpatient Visit on 08/05/2013  Component Date Value Ref Range Status  . WBC 08/05/2013 5.7  4.0 - 10.5 K/uL Final  . RBC 08/05/2013 4.97  4.22 - 5.81 MIL/uL Final  . Hemoglobin 08/05/2013 15.8  13.0 - 17.0 g/dL Final  . HCT 08/05/2013 44.7  39.0 - 52.0 % Final  . MCV 08/05/2013 89.9  78.0 - 100.0 fL Final  . MCH 08/05/2013 31.8  26.0 - 34.0 pg Final  . MCHC 08/05/2013 35.3  30.0 - 36.0 g/dL Final  . RDW 08/05/2013 12.7  11.5 - 15.5 % Final  . Platelets 08/05/2013 259  150 - 400 K/uL Final  . Sodium 08/05/2013 137  137 - 147 mEq/L Final  . Potassium 08/05/2013 4.8  3.7 - 5.3 mEq/L Final  . Chloride 08/05/2013 98  96 - 112 mEq/L Final  . CO2 08/05/2013 28  19 - 32 mEq/L Final  . Glucose, Bld 08/05/2013 96  70 - 99 mg/dL Final  . BUN 08/05/2013 11  6 - 23 mg/dL Final  . Creatinine, Ser 08/05/2013 1.11  0.50 - 1.35 mg/dL Final  . Calcium 08/05/2013 10.0  8.4 - 10.5 mg/dL Final  . Total Protein 08/05/2013 7.6  6.0 - 8.3 g/dL Final  . Albumin 08/05/2013 4.5  3.5 - 5.2 g/dL Final  . AST 08/05/2013 19  0 - 37 U/L Final  . ALT 08/05/2013 24  0 - 53 U/L Final  . Alkaline Phosphatase 08/05/2013 49  39 - 117 U/L Final  . Total Bilirubin 08/05/2013 0.6  0.3 - 1.2 mg/dL Final  . GFR calc non Af Amer 08/05/2013 82* >90 mL/min Final  . GFR calc  Af Amer 08/05/2013 >90  >90 mL/min Final   Comment: (NOTE)                          The eGFR has been calculated using the CKD EPI equation.                          This calculation has not been validated in all clinical situations.                          eGFR's persistently <90 mL/min signify possible Chronic Kidney                          Disease.   No results found for this basename: HGB,  in the last 72 hours No results found for this basename: WBC, RBC, HCT, PLT,  in the last 72 hours No results found for this basename: NA, K, CL, CO2, BUN, CREATININE, GLUCOSE, CALCIUM,  in the last 72 hours No results found for this basename: LABPT, INR,  in the last 72 hours  X-Rays:No results found.  EKG:No orders found for this or any previous visit.   Hospital Course: Patient was admitted to [facility] and taken to the OR and underwent the above state procedure without complications.  Patient tolerated the procedure well and was later transferred to the recovery room and then to the orthopaedic floor for postoperative care.  They were given PO and IV analgesics for pain control following their surgery. Discharge planning was consulted to help with postop disposition and equipment needs.  Patient was doing well same day of surgery and started to get up OOB with therapy on same day. They were given discharge instructions and dressing directions.  They were instructed on when to follow up in the office with Dr. Tonita Cong.  Discharge Medications: Prior to Admission medications   Medication Sig Start Date End Date Taking? Authorizing Provider  omeprazole (PRILOSEC OTC) 20 MG tablet Take 20 mg by mouth daily.   Yes Historical Provider, MD  ibuprofen (ADVIL,MOTRIN) 200 MG tablet Take 2-4 tablets (400-800 mg total) by mouth every 6 (six) hours as needed for mild pain or moderate pain. Resume after toradol is complete, do not take with toradol 08/08/13   [name] Bissell, PA-C  ketorolac (TORADOL)  10 MG tablet Take 1 tablet (10 mg total) by mouth every 6 (six) hours as needed. 08/08/13   [name], MD  methocarbamol (ROBAXIN) 500 MG tablet Take 1 tablet (500 mg total) by mouth 3 (three) times daily. 08/08/13   [name], MD  NON FORMULARY Calms Forte  Tablet/  3 monthly    Historical Provider, MD  oxyCODONE-acetaminophen (PERCOCET) 5-325 MG per tablet Take 1-2 tablets by mouth every 4 (four) hours as needed. 08/08/13   [name], MD    Diet: Regular diet Activity:WBAT Follow-up:in 10-14 days Disposition - Home Discharged Condition: good   Discharge Orders   Future Orders Complete By Expires   Call MD / Call 911  As directed    Comments:     If you experience chest pain or shortness of breath, CALL 911 and be transported to the hospital emergency room.  If you develope a fever above 101 F, pus (white drainage) or increased drainage or redness at the wound, or calf pain, call your surgeon's office.   Constipation Prevention  As directed    Comments:     Drink plenty of fluids.  Prune juice may be helpful.  You may use a stool softener, such as Colace (over the counter) 100 mg twice a day.  Use MiraLax (over the counter) for constipation as needed.   Diet - low sodium heart healthy  As directed    Increase activity slowly as tolerated  As directed        Medication List         ibuprofen 200 MG tablet  Commonly known as:  ADVIL,MOTRIN  Take 2-4 tablets (400-800 mg total) by mouth every 6 (six) hours as needed for mild pain or moderate pain. Resume after toradol is complete, do not take with toradol     ketorolac 10 MG tablet  Commonly known as:  TORADOL  Take 1 tablet (10 mg total) by mouth every 6 (six) hours as needed.     methocarbamol 500 MG tablet  Commonly known as:  ROBAXIN  Take 1 tablet (500 mg total) by mouth 3 (three) times daily.     NON FORMULARY  Calms Forte  Tablet/  3 monthly     omeprazole 20 MG tablet  Commonly known as:  PRILOSEC OTC    Take 20 mg by mouth daily.     oxyCODONE-acetaminophen 5-325 MG per tablet  Commonly known as:  PERCOCET  Take 1-2 tablets by mouth every 4 (four) hours as needed.           Follow-up Information   Follow up with BEANE,JEFFREY C, MD In 2 weeks.   Specialty:  Orthopedic Surgery   Contact information:   [address] [place] [address] [phone]       Signed: Cecilie Kicks. 08/08/2013, 4:20 PM

## 2013-08-08 NOTE — Discharge Instructions (Signed)
ARTHROSCOPIC KNEE SURGERY HOME CARE INSTRUCTIONS   PAIN You will be expected to have a moderate amount of pain in the affected knee for approximately two weeks.  However, the first two to four days will be the most severe in terms of the pain you will experience.  Prescriptions have been provided for you to take as needed for the pain.  The pain can be markedly reduced by using the ice/compressive bandage given.  Exchange the ice packs whenever they thaw.  During the night, keep the bandage on because it will still provide some compression for the swelling.  Also, keep the leg elevated on pillows above your heart, and this will help alleviate the pain and swelling.  MEDICATION Prescriptions have been provided to take as needed for pain. To prevent blood clots, take Aspirin 325mg  daily with a meal if not on a blood thinner and if no history of stomach ulcers.  ACTIVITY It is preferred that you stay on bedrest for approximately 24 hours.  However, you may go to the bathroom with help.  After this, you can start to be up and about progressively more.  Remember that the swelling may still increase after three to four days if you are up and doing too much.  You may put as much weight on the affected leg as pain will allow.  Use your crutches for comfort and safety.  However, as soon as you are able, you may discard the crutches and go without them.   DRESSING Keep the current dressing as dry as possible.  Two days after your surgery, you may remove the ice/compressive wrap, and surgical dressing.  You may now take a shower, but do not scrub the sounds directly with soap.  Let water rinse over these and gently wipe with your hand.  Reapply band-aids over the puncture wounds and more gauze if needed.  A slight amount of thin drainage can be normal at this time, and do not let it frighten you.  Reapply the ice/compressive wrap.  You may now repeat this every day each time you shower.  SYMPTOMS TO REPORT TO  YOUR DOCTOR  -Extreme pain.  -Extreme swelling.  -Temperature above 101 degrees that does not come down with acetaminophen     (Tylenol).  -Any changes in the feeling, color or movement of your toes.  -Extreme redness, heat, swelling or drainage at your incision  EXERCISE It is preferred that you begin to exercise on the day of your surgery.  Straight leg raises and short arc quads should be begun the afternoon or evening of surgery and continued until you come back for your follow-up appointment.   Attached is an instruction sheet on how to perform these two simple exercises.  Do these at least three times per day if not more.  You may bend your knee as much as is comfortable.  The puncture wounds may occasionally be slightly uncomfortable with bending of the knee.  Do not let this frighten you.  It is important to keep your knee motion, but do not overdo it.  If you have significant pain, simply do not bend the knee as far.   You will be given more exercises to perform at your first return visit.    RETURN APPOINTMENT Please make an appointment to be seen by your doctor in *** days from your surgery.  Patient Signature:  ________________________________________________________  Nurse's Signature:  ________________________________________________________ARTHROSCOPIC KNEE SURGERY HOME CARE INSTRUCTIONS   PAIN You will be expected  to have a moderate amount of pain in the affected knee for approximately two weeks.  However, the first two to four days will be the most severe in terms of the pain you will experience.  Prescriptions have been provided for you to take as needed for the pain.  The pain can be markedly reduced by using the ice/compressive bandage given.  Exchange the ice packs whenever they thaw.  During the night, keep the bandage on because it will still provide some compression for the swelling.  Also, keep the leg elevated on pillows above your heart, and this will help alleviate the  pain and swelling.  MEDICATION Prescriptions have been provided to take as needed for pain. To prevent blood clots, take Aspirin 325mg  daily with a meal if not on a blood thinner and if no history of stomach ulcers.  ACTIVITY It is preferred that you stay on bedrest for approximately 24 hours.  However, you may go to the bathroom with help.  After this, you can start to be up and about progressively more.  Remember that the swelling may still increase after three to four days if you are up and doing too much.  You may put as much weight on the affected leg as pain will allow.  Use your crutches for comfort and safety.  However, as soon as you are able, you may discard the crutches and go without them.   DRESSING Keep the current dressing as dry as possible.  Two days after your surgery, you may remove the ice/compressive wrap, and surgical dressing.  You may now take a shower, but do not scrub the sounds directly with soap.  Let water rinse over these and gently wipe with your hand.  Reapply band-aids over the puncture wounds and more gauze if needed.  A slight amount of thin drainage can be normal at this time, and do not let it frighten you.  Reapply the ice/compressive wrap.  You may now repeat this every day each time you shower.  SYMPTOMS TO REPORT TO YOUR DOCTOR  -Extreme pain.  -Extreme swelling.  -Temperature above 101 degrees that does not come down with acetaminophen     (Tylenol).  -Any changes in the feeling, color or movement of your toes.  -Extreme redness, heat, swelling or drainage at your incision  EXERCISE It is preferred that you begin to exercise on the day of your surgery.  Straight leg raises and short arc quads should be begun the afternoon or evening of surgery and continued until you come back for your follow-up appointment.   Attached is an instruction sheet on how to perform these two simple exercises.  Do these at least three times per day if not more.  You may bend  your knee as much as is comfortable.  The puncture wounds may occasionally be slightly uncomfortable with bending of the knee.  Do not let this frighten you.  It is important to keep your knee motion, but do not overdo it.  If you have significant pain, simply do not bend the knee as far.   You will be given more exercises to perform at your first return visit.    RETURN APPOINTMENT Please make an appointment to be seen by your doctor in *** days from your surgery.  Patient Signature:  ________________________________________________________  Nurse's Signature:  ________________________________________________________Change dressing daily, keep wound clean and dry. Use sling at times except when exercising or showering Ok to shower in 72 hours No driving  for 4-6 weeks No lifting for 6 weeks operative arm Pendulum exercises as instructed. Ok to move wrist,elbow, and hand. See Dr. Tonita Cong in 10-14 days. Take one aspirin per day with a meal if not on a blood thinner or allergic to aspirin.

## 2013-08-08 NOTE — Plan of Care (Signed)
Problem: Diagnosis - Type of Surgery Goal: General Surgical Patient Education (See Patient Education module for education specifics) Right rotator cuff

## 2013-08-08 NOTE — Anesthesia Preprocedure Evaluation (Addendum)
Anesthesia Evaluation  Patient identified by MRN, date of birth, ID band Patient awake    Reviewed: Allergy & Precautions, H&P , NPO status , Patient's Chart, lab work & pertinent test results  Airway Mallampati: II TM Distance: >3 FB Neck ROM: full    Dental  (+) Chipped, Dental Advisory Given Small chips left front upper:   Pulmonary neg pulmonary ROS, former smoker,  breath sounds clear to auscultation  Pulmonary exam normal       Cardiovascular Exercise Tolerance: Good negative cardio ROS  Rhythm:regular Rate:Normal     Neuro/Psych negative neurological ROS  negative psych ROS   GI/Hepatic negative GI ROS, Neg liver ROS, GERD-  Medicated and Controlled,  Endo/Other  negative endocrine ROS  Renal/GU negative Renal ROS  negative genitourinary   Musculoskeletal   Abdominal   Peds  Hematology negative hematology ROS (+)   Anesthesia Other Findings   Reproductive/Obstetrics negative OB ROS                         Anesthesia Physical Anesthesia Plan  ASA: I  Anesthesia Plan: General   Post-op Pain Management:    Induction: Intravenous  Airway Management Planned: Oral ETT  Additional Equipment:   Intra-op Plan:   Post-operative Plan: Extubation in OR  Informed Consent: I have reviewed the patients History and Physical, chart, labs and discussed the procedure including the risks, benefits and alternatives for the proposed anesthesia with the patient or authorized representative who has indicated his/her understanding and acceptance.   Dental Advisory Given  Plan Discussed with: CRNA and Surgeon  Anesthesia Plan Comments:         Anesthesia Quick Evaluation

## 2013-08-08 NOTE — Transfer of Care (Signed)
Immediate Anesthesia Transfer of Care Note  Patient: Richard Spencer  Procedure(s) Performed: Procedure(s): RIGHT SHOULDER ARTHROSCOPY WITH MINI OPEN ROTATOR CUFF REPAIR AND SUBACROMIAL DECOMPRESSION  (Right)  Patient Location: PACU  Anesthesia Type:General  Level of Consciousness: awake, alert  and oriented  Airway & Oxygen Therapy: Patient Spontanous Breathing and Patient connected to face mask oxygen  Post-op Assessment: Report given to PACU RN and Post -op Vital signs reviewed and stable  Post vital signs: Reviewed and stable  Complications: No apparent anesthesia complications

## 2013-08-08 NOTE — Preoperative (Signed)
Beta Blockers   Reason not to administer Beta Blockers:Not Applicable 

## 2013-08-08 NOTE — Brief Op Note (Signed)
08/08/2013  12:42 PM  PATIENT:  Roby Lofts  38 y.o. male  PRE-OPERATIVE DIAGNOSIS:  ROTATOR CUFF ON RIGHT  POST-OPERATIVE DIAGNOSIS:  ROTATOR CUFF ON RIGHT  PROCEDURE:  Procedure(s): RIGHT SHOULDER ARTHROSCOPY WITH MINI OPEN ROTATOR CUFF REPAIR AND SUBACROMIAL DECOMPRESSION  (Right)  SURGEON:  Surgeon(s) and Role:    * [name], MD - Primary  PHYSICIAN ASSISTANT:   ASSISTANTS: Melburn Hake ANESTHESIA:   general  EBL:     BLOOD ADMINISTERED:none  DRAINS: none   LOCAL MEDICATIONS USED:  MARCAINE     SPECIMEN:  No Specimen  DISPOSITION OF SPECIMEN:  N/A  COUNTS:  YES  TOURNIQUET:  * No tourniquets in log *  DICTATION: .Other Dictation: Dictation Number [id number]  PLAN OF CARE: Admit for overnight observation  PATIENT DISPOSITION:  PACU - hemodynamically stable.   Delay start of Pharmacological VTE agent (>24hrs) due to surgical blood loss or risk of bleeding: yes

## 2013-08-08 NOTE — H&P (View-Only) (Signed)
Richard Spencer is an 38 y.o. male.   Chief Complaint: shoulder pain HPI: The patient is a 37 year old male who presents today for follow up of their shoulder. The patient is being followed for their bilateral shoulder pain. They are 2 year(s) out from when symptoms began. Symptoms reported today include: pain. and report their pain level to be moderate to severe. Current treatment includes: NSAIDs (Ibuprofen). The patient presents today following MRI.  Richard Spencer follows up with bilateral shoulder pain, both sides are fairly severe.  Past Medical History  Diagnosis Date  . Hypercholesterolemia   . Allergic rhinitis   . GERD (gastroesophageal reflux disease)     Past Surgical History  Procedure Laterality Date  . Lumbar laminectomy  06/2010    Family History  Problem Relation Age of Onset  . Prostate cancer Father   . Coronary artery disease Father   . Heart attack Father   . Heart disease Father   . Hypertension Mother   . Rheum arthritis Maternal Grandmother   . Colon cancer Neg Hx    Social History:  reports that he has never smoked. His smokeless tobacco use includes Snuff. He reports that he drinks alcohol. He reports that he does not use illicit drugs.  Allergies:  Allergies  Allergen Reactions  . Erythromycin Other (See Comments)    GI upset     (Not in a hospital admission)  No results found for this or any previous visit (from the past 48 hour(s)). No results found.  Review of Systems  Constitutional: Negative.   HENT: Negative.   Eyes: Negative.   Respiratory: Negative.   Cardiovascular: Negative.   Gastrointestinal: Negative.   Genitourinary: Negative.   Musculoskeletal: Positive for joint pain.  Skin: Negative.   Neurological: Negative.   Endo/Heme/Allergies: Negative.   Psychiatric/Behavioral: Negative.     There were no vitals taken for this visit. Physical Exam  Constitutional: He is oriented to person, place, and time. He appears  well-developed and well-nourished.  HENT:  Head: Normocephalic and atraumatic.  Eyes: Conjunctivae and EOM are normal. Pupils are equal, round, and reactive to light.  Neck: Normal range of motion. Neck supple.  Cardiovascular: Normal rate and regular rhythm.   Respiratory: Effort normal and breath sounds normal.  GI: Soft. Bowel sounds are normal.  Musculoskeletal:  Examination of the left, he has a positive impingement sign.  Inspection of the shoulder revealed no ecchymosis, soft tissue swelling, or deformity. On palpation, nontender over the AC and in the subacromial region. On range of motion the patient had full range of motion. Provocative signs indicated no sulcus sign, negative speed's test. Negative lift off. Sensory exam was intact and motor function was normal in the deltoid and the rotator cuff.  On the right, positive impingement sign, positive secondary impingement sign. Tender in the anterior subacromial region. Some weakness in abduction.  Inspection of the shoulder revealed no ecchymosis, soft tissue swelling, or deformity. On palpation, nontender over the AC. Provocative signs indicated no sulcus sign, negative speed's test. Negative lift off. Sensory exam was intact and motor function was normal in the deltoid and the rotator cuff.  Neurological: He is alert and oriented to person, place, and time. He has normal reflexes.  Skin: Skin is warm and dry.  Psychiatric: He has a normal mood and affect.    On the left MRI indicates some bursitis, tendonitis, rotator cuff arthropathy. No definitive rotator cuff tear.  On the right he has greater   than 50% interstitial tear of the supraspinatus at its insertion. Mild to moderate AC arthrosis, anterior and inferior glenoid labral tear, chronic, nontender.   Assessment/Plan R shoulder RCT Persistent shoulder pain secondary to partial tear rotator cuff greater than 50% thickness with underlying labral tear, not symptomatic.  Pain is mainly abduction, overhead activities. This has been refractory to rest, activity modifications, home exercise program, two years in duration.  Discussed options bilaterally, for the left shoulder arthroscopy, for the right arthroscopy, subacromial decompression, and repair of partial tear, it is greater than 50%, and with his continued symptoms that would be one option for him versus living with his symptoms and expected management. Discussed avoiding a full thickness tear and retracted tendon. The patient would like to proceed with the surgery. Discussed time out of work. 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 He should be able to accelerate his recovery. We will proceed accordingly.  I had a long discussion with the patient concerning risks and benefits of shoulder arthroscopy including no changes, worsening in symptoms. Also the need for manipulation of the extremity, need for open rotator cuff repair. Also discussed infection, DVT, PE, anesthetic complications, etc. Also included was the possibility of requirement for a repeat debridement in the future. Perioperative course was discussed in detail as well as time to recovery. An illustrated handout was provided and discussed in detail.  Plan Right shoulder arthroscopy, SAD, mini-open RCR  BISSELL, JACLYN M. 07/26/2013, 3:12 PM

## 2013-08-08 NOTE — Interval H&P Note (Signed)
History and Physical Interval Note:  08/08/2013 7:43 AM  Richard Spencer  has presented today for surgery, with the diagnosis of ROTATOR CUFF ON RIGHT  The various methods of treatment have been discussed with the patient and family. After consideration of risks, benefits and other options for treatment, the patient has consented to  Procedure(s): RIGHT SHOULDER ARTHROSCOPY WITH MINI OPEN ROTATOR CUFF REPAIR AND SUBACROMIAL DECOMPRESSION  (Right) as a surgical intervention .  The patient's history has been reviewed, patient examined, no change in status, stable for surgery.  I have reviewed the patient's chart and labs.  Questions were answered to the patient's satisfaction.     [name] C

## 2013-08-09 NOTE — Op Note (Signed)
Richard Spencer, WHETSEL NO.:  [id number]  MEDICAL RECORD NO.:  [id number]  LOCATION:  [id number]                         FACILITY:  [facility]  PHYSICIAN:  [name], M.D.    DATE OF BIRTH:  1975/09/27  DATE OF PROCEDURE: DATE OF DISCHARGE:  08/08/2013                              OPERATIVE REPORT   PREOPERATIVE DIAGNOSIS:  Impingement syndrome, right shoulder, rotator cuff tear.  POSTOPERATIVE DIAGNOSIS:  Impingement syndrome, right shoulder, rotator cuff tear.  PROCEDURE PERFORMED:  Right shoulder arthroscopy, subacromial decompression, mini open rotator cuff repair.  ANESTHESIA:  General.  ASSISTANT:  Cleophas Dunker, PA  HISTORY:  This is a 38 year old, persistent impingement pain and rotator cuff tear, greater than 50% supraspinatus indicated for decompression. Risk and benefits discussed including bleeding, infection, suboptimal range of motion, recurrent tear, persistent pain, etc.  TECHNIQUE:  The patient in supine beach-chair position, after the induction of adequate general anesthesia, 2 g of Kefzol, the right shoulder was prepped and draped in usual sterile fashion.  Full range of motion was noted under anesthesia.  Surgical marker utilized to delineate the acromion AC joint coracoid standard posterior to lateral and anterolateral portals were utilized with incision through the skin only.  The arm in 70 and 30 position.  We advanced the arthroscopic camera in the glenohumeral joint penetrating atraumatically.  This was in line with coracoid irrigation, insufflated the joint 65 mm.  Inspection revealed normal labrum, biceps, glenoid, and humerus.  Some fraying of the undersurface of the supraspinatus was noted at the junction of the infraspinatus.  No labral tearing.  Biceps was unremarkable in its groove.  Subscap unremarkable.  We redirected the camera in the subacromial space triangulating with a lateral portal.  In the subacromial space, there  was severe hypertrophic synovitis and bursitis noted, and a thickened CA ligament.  Introduced a shaver and performed a bursectomy.  We released the morselize CA ligament with a Arthur wand.  Tear was noted on the junction of the supraspinatus and infraspinatus.  __________ a half centimeter to a centimeter in length.  Frayed tissue was noted around it.  It converted then to a mini open procedure.  After removing all arthroscopic equipment, I made a small incision over the anterolateral aspect of the acromion, divided the Raphe and in line with that.  Used the self-retaining retractor.  Access to the subacromial space.  We removed the spur with 3-mm Kerrison off the anterior lateral aspect of the acromion.  The tear was noted, was debrided with a #15 blade and a Beyer rongeur.  I repaired it side-to-side with 1 Vicryl interrupted figure-of-eight sutures.  A small tear was noted and the remainder of the cuff was unremarkable.  Copiously irrigated the wound. I then repaired the Raphe with 1 Vicryl, subcu with 2, and skin with Prolene.  Steri-Strips were applied.  Placed in a sling, extubated without difficulty, and transported to the recovery room in satisfactory condition.  The patient tolerated the procedure well.  No complications.  Minimum blood loss.  Cleophas Dunker, [place], was utilized as the assistant to hold the arm, apply traction, and control the influent and outfluent of the arthroscopic fluid.  [name], M.D.     Geralynn Rile  D:  08/08/2013  T:  08/08/2013  Job:  [id number]

## 2013-08-12 ENCOUNTER — Encounter (HOSPITAL_COMMUNITY): Payer: Self-pay | Admitting: Specialist

## 2015-12-10 ENCOUNTER — Ambulatory Visit: Payer: Self-pay | Admitting: Orthopedic Surgery

## 2015-12-24 ENCOUNTER — Ambulatory Visit: Payer: Self-pay | Admitting: Orthopedic Surgery

## 2015-12-24 NOTE — H&P (Signed)
Richard Spencer is an 40 y.o. male.   Chief Complaint: L shoulder pain HPI: The patient is a 40 year old male who presents today for follow up of their shoulder. The patient is being followed for their left shoulder pain. They are 4 weeks and 1 day out from when symptoms began. Symptoms reported today include: pain. and report their pain level to be 7 / 10. Current treatment includes: NSAIDs (Advil) and use of ice and heat prn. The following medication has been used for pain control: antiinflammatory medication. The patient presents today following MRI.  Richard Spencer follows up his MRI. He does have a T2 image coronal supraspinatus. Essentially a full thickness tear, some delamination. Bursitis is noted. He reports persistent pain.  Past Medical History:  Diagnosis Date  . Allergic rhinitis   . GERD (gastroesophageal reflux disease)   . Hypercholesterolemia   . Rotator cuff tear, right     Past Surgical History:  Procedure Laterality Date  . LUMBAR LAMINECTOMY  06/2010  . SHOULDER ARTHROSCOPY WITH OPEN ROTATOR CUFF REPAIR Right 08/08/2013   Procedure: RIGHT SHOULDER ARTHROSCOPY WITH MINI OPEN ROTATOR CUFF REPAIR AND SUBACROMIAL DECOMPRESSION ;  Surgeon: [name], MD;  Location: [facility];  Service: Orthopedics;  Laterality: Right;    Family History  Problem Relation Age of Onset  . Prostate cancer Father   . Coronary artery disease Father   . Heart attack Father   . Heart disease Father   . Hypertension Mother   . Rheum arthritis Maternal Grandmother   . Colon cancer Neg Hx    Social History:  reports that he quit smoking about 23 years ago. His smokeless tobacco use includes Snuff. He reports that he drinks alcohol. He reports that he does not use drugs.  Allergies:  Allergies  Allergen Reactions  . Erythromycin Other (See Comments)    GI upset     (Not in a hospital admission)  No results found for this or any previous visit (from the past 48 hour(s)). No results  found.  Review of Systems  Constitutional: Negative.   HENT: Negative.   Eyes: Negative.   Respiratory: Negative.   Cardiovascular: Negative.   Gastrointestinal: Negative.   Genitourinary: Negative.   Musculoskeletal: Positive for joint pain.  Skin: Negative.   Neurological: Negative.   Psychiatric/Behavioral: Negative.     There were no vitals taken for this visit. Physical Exam  Constitutional: He is oriented to person, place, and time. He appears well-developed.  HENT:  Head: Normocephalic.  Eyes: Pupils are equal, round, and reactive to light.  Neck: Normal range of motion.  Cardiovascular: Normal rate.   Respiratory: Effort normal.  GI: Soft.  Musculoskeletal:  On exam, he is tender in the anterior subacromial region. Positive drop arm sign. Weak in external rotation. Nontender AC. Inspection of the shoulder revealed no ecchymosis, soft tissue swelling or deformity. Provocative signs indicated no impingement sign, no sulcus sign, and negative speed's test. Negative lift off. Sensory exam was intact and motor function was normal in the deltoid and the rotator cuff.  Neurological: He is alert and oriented to person, place, and time.  Skin: Skin is warm and dry.    MRI reviewed T2 coronal, full-thickness tear and delamination.  Assessment/Plan Full-thickness tear of rotator cuff, delamination and slight retraction.  With this pain and presence of a tear, we discussed shoulder arthroscopy and mini open rotator cuff repair.  I had a long discussion with the patient concerning the risks  and benefits of a mini open rotator cuff repair, including bleeding, infection, prolonged postoperative recovery, which may require 3 to 5 months until maximum medical improvement. Overnight procedure with initiation of early passive range of motion within physical therapy. Avoid any active motion for the first six weeks. This is all in an effort to avoid recurrent tear of the rotator cuff and  adhesive capsulitis. Return to work without use of the arm can be obtained following two weeks. However, driving will be a challenge. We also discussed the possibility of requiring implants including bone anchors, as well as an Allograft patch graft if a massive rotator cuff tear is encountered. Removal of any bones for spurs as well as bursitis will be performed during the procedure and also any associated anesthetic complications as well.  Cannot do this until the end of August, we will have to schedule for that outpatient, general. Home with Toradol, gave him prescription for that. The side effect is discussed.  Plan L shoulder arthroscopy, SAD, mini-open RCR  BISSELL, [name], PA-C for Dr. Tonita Cong 12/24/2015, 1:36 PM

## 2016-01-15 ENCOUNTER — Ambulatory Visit: Payer: Self-pay | Admitting: Orthopedic Surgery

## 2016-01-15 NOTE — Progress Notes (Signed)
Dr Tonita Cong or Kennyth Lose Consent states mini open rotator cuff tear-  rotator cuff repair? , or just mini rotator cuff?  PST appt Monday-  Please clarify in EPIC  Thanks

## 2016-01-15 NOTE — Patient Instructions (Addendum)
Richard Spencer  01/15/2016   Your procedure is scheduled on: 01/21/16  Report to [facility] Main  Entrance take [place]  elevators to 3rd floor to  [place] at [place] AM.  Call this number if you have problems the morning of surgery [phone]   Remember: ONLY 1 PERSON MAY GO WITH YOU TO SHORT STAY TO GET  READY MORNING OF [place].  Do not eat food or drink liquids :After Midnight.     Take these medicines the morning of surgery with A SIP OF WATER: Omeprazole                                 You may not have any metal on your body including hair pins and              piercings  Do not wear jewelry, make-up, lotions, powders or perfumes, deodorant             Do not wear nail polish.  Do not shave  48 hours prior to surgery.              Men may shave face and neck.   Do not bring valuables to the hospital. [place].  Contacts, dentures or bridgework may not be worn into surgery.  Leave suitcase in the car. After surgery it may be brought to your room.           [place] - Preparing for Surgery Before surgery, you can play an important role.  Because skin is not sterile, your skin needs to be as free of germs as possible.  You can reduce the number of germs on your skin by washing with CHG (chlorahexidine gluconate) soap before surgery.  CHG is an antiseptic cleaner which kills germs and bonds with the skin to continue killing germs even after washing. Please DO NOT use if you have an allergy to CHG or antibacterial soaps.  If your skin becomes reddened/irritated stop using the CHG and inform your nurse when you arrive at Short Stay. Do not shave (including legs and underarms) for at least 48 hours prior to the first CHG shower.  You may shave your face/neck. Please follow these instructions carefully:  1.  Shower with CHG Soap the night before surgery and the  morning of Surgery.  2.  If you  choose to wash your hair, wash your hair first as usual with your  normal  shampoo.  3.  After you shampoo, rinse your hair and body thoroughly to remove the  shampoo.                           4.  Use CHG as you would any other liquid soap.  You can apply chg directly  to the skin and wash                       Gently with a scrungie or clean washcloth.  5.  Apply the CHG Soap to your body ONLY FROM THE NECK DOWN.   Do not use on face/ open  Wound or open sores. Avoid contact with eyes, ears mouth and genitals (private parts).                       Wash face,  Genitals (private parts) with your normal soap.             6.  Wash thoroughly, paying special attention to the area where your surgery  will be performed.  7.  Thoroughly rinse your body with warm water from the neck down.  8.  DO NOT shower/wash with your normal soap after using and rinsing off  the CHG Soap.                9.  Pat yourself dry with a clean towel.            10.  Wear clean pajamas.            11.  Place clean sheets on your bed the night of your first shower and do not  sleep with pets. Day of Surgery : Do not apply any lotions/deodorants the morning of surgery.  Please wear clean clothes to the hospital/surgery center.  FAILURE TO FOLLOW THESE INSTRUCTIONS MAY RESULT IN THE CANCELLATION OF YOUR SURGERY PATIENT SIGNATURE_________________________________  NURSE SIGNATURE__________________________________  ________________________________________________________________________   Richard Spencer  An incentive spirometer is a tool that can help keep your lungs clear and active. This tool measures how well you are filling your lungs with each breath. Taking long deep breaths may help reverse or decrease the chance of developing breathing (pulmonary) problems (especially infection) following:  A long period of time when you are unable to move or be active. BEFORE THE PROCEDURE   If  the spirometer includes an indicator to show your best effort, your nurse or respiratory therapist will set it to a desired goal.  If possible, sit up straight or lean slightly forward. Try not to slouch.  Hold the incentive spirometer in an upright position. INSTRUCTIONS FOR USE  1. Sit on the edge of your bed if possible, or sit up as far as you can in bed or on a chair. 2. Hold the incentive spirometer in an upright position. 3. Breathe out normally. 4. Place the mouthpiece in your mouth and seal your lips tightly around it. 5. Breathe in slowly and as deeply as possible, raising the piston or the ball toward the top of the column. 6. Hold your breath for 3-5 seconds or for as long as possible. Allow the piston or ball to fall to the bottom of the column. 7. Remove the mouthpiece from your mouth and breathe out normally. 8. Rest for a few seconds and repeat Steps 1 through 7 at least 10 times every 1-2 hours when you are awake. Take your time and take a few normal breaths between deep breaths. 9. The spirometer may include an indicator to show your best effort. Use the indicator as a goal to work toward during each repetition. 10. After each set of 10 deep breaths, practice coughing to be sure your lungs are clear. If you have an incision (the cut made at the time of surgery), support your incision when coughing by placing a pillow or rolled up towels firmly against it. Once you are able to get out of bed, walk around indoors and cough well. You may stop using the incentive spirometer when instructed by your caregiver.  RISKS AND COMPLICATIONS  Take your time so you do not get  dizzy or light-headed.  If you are in pain, you may need to take or ask for pain medication before doing incentive spirometry. It is harder to take a deep breath if you are having pain. AFTER USE  Rest and breathe slowly and easily.  It can be helpful to keep track of a log of your progress. Your caregiver can  provide you with a simple table to help with this. If you are using the spirometer at home, follow these instructions: [employer] IF:   You are having difficultly using the spirometer.  You have trouble using the spirometer as often as instructed.  Your pain medication is not giving enough relief while using the spirometer.  You develop fever of 100.5 F (38.1 C) or higher. SEEK IMMEDIATE MEDICAL CARE IF:   You cough up bloody sputum that had not been present before.  You develop fever of 102 F (38.9 C) or greater.  You develop worsening pain at or near the incision site. MAKE SURE YOU:   Understand these instructions.  Will watch your condition.  Will get help right away if you are not doing well or get worse. Document Released: 09/19/2006 Document Revised: 08/01/2011 Document Reviewed: 11/20/2006 [facility] Patient Information 2014 [place], [place].   ________________________________________________________________________

## 2016-01-18 ENCOUNTER — Encounter (HOSPITAL_COMMUNITY)
Admission: RE | Admit: 2016-01-18 | Discharge: 2016-01-18 | Disposition: A | Discharge status: Home / Self Care | Payer: PRIVATE HEALTH INSURANCE | Source: Ambulatory Visit | Attending: Specialist | Admitting: Specialist

## 2016-01-18 ENCOUNTER — Encounter (HOSPITAL_COMMUNITY): Payer: Self-pay

## 2016-01-18 DIAGNOSIS — Z79899 Other long term (current) drug therapy: Secondary | ICD-10-CM | POA: Diagnosis not present

## 2016-01-18 DIAGNOSIS — K219 Gastro-esophageal reflux disease without esophagitis: Secondary | ICD-10-CM | POA: Diagnosis not present

## 2016-01-18 DIAGNOSIS — M7542 Impingement syndrome of left shoulder: Secondary | ICD-10-CM | POA: Diagnosis not present

## 2016-01-18 DIAGNOSIS — S46012A Strain of muscle(s) and tendon(s) of the rotator cuff of left shoulder, initial encounter: Secondary | ICD-10-CM | POA: Diagnosis not present

## 2016-01-18 DIAGNOSIS — F17290 Nicotine dependence, other tobacco product, uncomplicated: Secondary | ICD-10-CM | POA: Diagnosis not present

## 2016-01-18 DIAGNOSIS — X58XXXA Exposure to other specified factors, initial encounter: Secondary | ICD-10-CM | POA: Diagnosis not present

## 2016-01-18 LAB — CBC
HCT: 43.3 % (ref 39.0–52.0)
Hemoglobin: 14.8 g/dL (ref 13.0–17.0)
MCH: 31 pg (ref 26.0–34.0)
MCHC: 34.2 g/dL (ref 30.0–36.0)
MCV: 90.8 fL (ref 78.0–100.0)
PLATELETS: 295 10*3/uL (ref 150–400)
RBC: 4.77 MIL/uL (ref 4.22–5.81)
RDW: 13.2 % (ref 11.5–15.5)
WBC: 6.1 10*3/uL (ref 4.0–10.5)

## 2016-01-18 LAB — ABO/RH: ABO/RH(D): A POS

## 2016-01-20 NOTE — Anesthesia Preprocedure Evaluation (Addendum)
Anesthesia Evaluation  Patient identified by MRN, date of birth, ID band Patient awake    Reviewed: Allergy & Precautions, NPO status , Patient's Chart, lab work & pertinent test results  History of Anesthesia Complications Negative for: history of anesthetic complications  Airway Mallampati: III  TM Distance: >3 FB Neck ROM: Full    Dental  (+) Dental Advisory Given,    Pulmonary neg shortness of breath, neg sleep apnea, neg COPD, neg recent URI, former smoker,    Pulmonary exam normal breath sounds clear to auscultation       Cardiovascular (-) hypertension(-) angina(-) Past MI, (-) Cardiac Stents and (-) CABG negative cardio ROS   Rhythm:Regular Rate:Normal     Neuro/Psych neg Seizures negative neurological ROS     GI/Hepatic Neg liver ROS, GERD  Medicated and Controlled,  Endo/Other  negative endocrine ROS  Renal/GU negative Renal ROS     Musculoskeletal   Abdominal (+) - obese,   Peds  Hematology negative hematology ROS (+)   Anesthesia Other Findings HLD  Reproductive/Obstetrics                           Anesthesia Physical Anesthesia Plan  ASA: II  Anesthesia Plan: General   Post-op Pain Management:    Induction: Intravenous  Airway Management Planned: Oral ETT  Additional Equipment:   Intra-op Plan:   Post-operative Plan: Extubation in OR  Informed Consent: I have reviewed the patients History and Physical, chart, labs and discussed the procedure including the risks, benefits and alternatives for the proposed anesthesia with the patient or authorized representative who has indicated his/her understanding and acceptance.   Dental advisory given  Plan Discussed with:   Anesthesia Plan Comments: (Risks of general anesthesia discussed including, but not limited to, sore throat, hoarse voice, chipped/damaged teeth, injury to vocal cords, nausea and vomiting, allergic  reactions, lung infection, heart attack, stroke, and death. All questions answered.  Offered patient a nerve block, which he declined, as he stated he did not have issues with pain control after his last shoulder surgery when he did not receive a nerve block.)       Anesthesia Quick Evaluation

## 2016-01-21 ENCOUNTER — Ambulatory Visit (HOSPITAL_COMMUNITY)
Admission: RE | Admit: 2016-01-21 | Discharge: 2016-01-21 | Disposition: A | Discharge status: Home / Self Care | Payer: PRIVATE HEALTH INSURANCE | Source: Ambulatory Visit | Attending: Specialist | Admitting: Specialist

## 2016-01-21 ENCOUNTER — Ambulatory Visit (HOSPITAL_COMMUNITY): Payer: PRIVATE HEALTH INSURANCE | Admitting: Anesthesiology

## 2016-01-21 ENCOUNTER — Encounter (HOSPITAL_COMMUNITY): Payer: Self-pay

## 2016-01-21 ENCOUNTER — Encounter (HOSPITAL_COMMUNITY)
Admission: RE | Disposition: A | Discharge status: Home / Self Care | Payer: Self-pay | Source: Ambulatory Visit | Attending: Specialist

## 2016-01-21 DIAGNOSIS — S46012A Strain of muscle(s) and tendon(s) of the rotator cuff of left shoulder, initial encounter: Secondary | ICD-10-CM | POA: Diagnosis not present

## 2016-01-21 DIAGNOSIS — K219 Gastro-esophageal reflux disease without esophagitis: Secondary | ICD-10-CM | POA: Insufficient documentation

## 2016-01-21 DIAGNOSIS — X58XXXA Exposure to other specified factors, initial encounter: Secondary | ICD-10-CM | POA: Insufficient documentation

## 2016-01-21 DIAGNOSIS — M7542 Impingement syndrome of left shoulder: Secondary | ICD-10-CM | POA: Insufficient documentation

## 2016-01-21 DIAGNOSIS — Z79899 Other long term (current) drug therapy: Secondary | ICD-10-CM | POA: Insufficient documentation

## 2016-01-21 DIAGNOSIS — F17290 Nicotine dependence, other tobacco product, uncomplicated: Secondary | ICD-10-CM | POA: Insufficient documentation

## 2016-01-21 HISTORY — PX: SHOULDER ARTHROSCOPY WITH OPEN ROTATOR CUFF REPAIR AND DISTAL CLAVICLE ACROMINECTOMY: SHX5683

## 2016-01-21 LAB — TYPE AND SCREEN
ABO/RH(D): A POS
Antibody Screen: NEGATIVE

## 2016-01-21 SURGERY — SHOULDER ARTHROSCOPY WITH OPEN ROTATOR CUFF REPAIR AND DISTAL CLAVICLE ACROMINECTOMY
Anesthesia: General | Site: Shoulder | Laterality: Left

## 2016-01-21 MED ORDER — POLYMYXIN B SULFATE 500000 UNITS IJ SOLR
INTRAMUSCULAR | Status: AC
  Filled 2016-01-21: qty 1

## 2016-01-21 MED ORDER — MIDAZOLAM HCL 5 MG/5ML IJ SOLN
INTRAMUSCULAR | Status: DC | PRN
  Administered 2016-01-21: 2 mg via INTRAVENOUS

## 2016-01-21 MED ORDER — PROPOFOL 10 MG/ML IV BOLUS
INTRAVENOUS | Status: AC
Start: 2016-01-21 — End: 2016-01-21
  Filled 2016-01-21: qty 20

## 2016-01-21 MED ORDER — PROMETHAZINE HCL 25 MG/ML IJ SOLN: 6.2500 mg | INTRAMUSCULAR | Status: DC | PRN

## 2016-01-21 MED ORDER — LACTATED RINGERS IV SOLN
INTRAVENOUS | Status: DC
  Administered 2016-01-21: 07:00:00 via INTRAVENOUS

## 2016-01-21 MED ORDER — ONDANSETRON HCL 4 MG/2ML IJ SOLN
INTRAMUSCULAR | Status: DC | PRN
  Administered 2016-01-21: 4 mg via INTRAVENOUS

## 2016-01-21 MED ORDER — SODIUM CHLORIDE 0.9 % IJ SOLN
INTRAMUSCULAR | Status: AC
  Filled 2016-01-21: qty 10

## 2016-01-21 MED ORDER — METHOCARBAMOL 500 MG PO TABS: 500.0000 mg | ORAL_TABLET | Freq: Four times a day (QID) | ORAL | 1 refills | Status: DC | PRN

## 2016-01-21 MED ORDER — STERILE WATER FOR IRRIGATION IR SOLN
Status: DC | PRN
  Administered 2016-01-21: 1000 mL

## 2016-01-21 MED ORDER — ASPIRIN EC 325 MG PO TBEC: 325.0000 mg | DELAYED_RELEASE_TABLET | Freq: Every day | ORAL | 0 refills | Status: DC

## 2016-01-21 MED ORDER — BUPIVACAINE-EPINEPHRINE 0.5% -1:200000 IJ SOLN
INTRAMUSCULAR | Status: DC | PRN
  Administered 2016-01-21: 29 mL

## 2016-01-21 MED ORDER — CEFAZOLIN SODIUM-DEXTROSE 2-4 GM/100ML-% IV SOLN
INTRAVENOUS | Status: AC
  Filled 2016-01-21: qty 100

## 2016-01-21 MED ORDER — LIDOCAINE HCL (CARDIAC) 20 MG/ML IV SOLN
INTRAVENOUS | Status: DC | PRN
  Administered 2016-01-21: 100 mg via INTRAVENOUS

## 2016-01-21 MED ORDER — FENTANYL CITRATE (PF) 100 MCG/2ML IJ SOLN
INTRAMUSCULAR | Status: DC | PRN
  Administered 2016-01-21: 50 ug via INTRAVENOUS
  Administered 2016-01-21: 100 ug via INTRAVENOUS
  Administered 2016-01-21 (×2): 50 ug via INTRAVENOUS

## 2016-01-21 MED ORDER — ROCURONIUM BROMIDE 10 MG/ML (PF) SYRINGE
PREFILLED_SYRINGE | INTRAVENOUS | Status: AC
  Filled 2016-01-21: qty 10

## 2016-01-21 MED ORDER — HYDROMORPHONE HCL 2 MG/ML IJ SOLN
INTRAMUSCULAR | Status: AC
  Filled 2016-01-21: qty 1

## 2016-01-21 MED ORDER — EPINEPHRINE HCL 1 MG/ML IJ SOLN
INTRAMUSCULAR | Status: DC | PRN
  Administered 2016-01-21: 2 mg

## 2016-01-21 MED ORDER — DEXAMETHASONE SODIUM PHOSPHATE 10 MG/ML IJ SOLN
INTRAMUSCULAR | Status: AC
  Filled 2016-01-21: qty 1

## 2016-01-21 MED ORDER — OXYCODONE HCL 5 MG PO TABS
5.0000 mg | ORAL_TABLET | ORAL | Status: DC | PRN
  Administered 2016-01-21: 5 mg via ORAL
  Filled 2016-01-21: qty 1

## 2016-01-21 MED ORDER — SUGAMMADEX SODIUM 200 MG/2ML IV SOLN
INTRAVENOUS | Status: DC | PRN
  Administered 2016-01-21: 150 mg via INTRAVENOUS

## 2016-01-21 MED ORDER — FENTANYL CITRATE (PF) 100 MCG/2ML IJ SOLN
INTRAMUSCULAR | Status: AC
  Filled 2016-01-21: qty 2

## 2016-01-21 MED ORDER — ROCURONIUM BROMIDE 100 MG/10ML IV SOLN
INTRAVENOUS | Status: DC | PRN
  Administered 2016-01-21: 10 mg via INTRAVENOUS
  Administered 2016-01-21: 40 mg via INTRAVENOUS
  Administered 2016-01-21: 20 mg via INTRAVENOUS

## 2016-01-21 MED ORDER — DEXAMETHASONE SODIUM PHOSPHATE 10 MG/ML IJ SOLN
INTRAMUSCULAR | Status: DC | PRN
  Administered 2016-01-21: 10 mg via INTRAVENOUS

## 2016-01-21 MED ORDER — PHENYLEPHRINE 40 MCG/ML (10ML) SYRINGE FOR IV PUSH (FOR BLOOD PRESSURE SUPPORT)
PREFILLED_SYRINGE | INTRAVENOUS | Status: AC
  Filled 2016-01-21: qty 10

## 2016-01-21 MED ORDER — HYDROMORPHONE HCL 1 MG/ML IJ SOLN
INTRAMUSCULAR | Status: DC | PRN
  Administered 2016-01-21: .2 mg via INTRAVENOUS
  Administered 2016-01-21: .4 mg via INTRAVENOUS

## 2016-01-21 MED ORDER — DOCUSATE SODIUM 100 MG PO CAPS: 100.0000 mg | ORAL_CAPSULE | Freq: Two times a day (BID) | ORAL | 1 refills | Status: DC | PRN

## 2016-01-21 MED ORDER — LIDOCAINE 2% (20 MG/ML) 5 ML SYRINGE
INTRAMUSCULAR | Status: AC
  Filled 2016-01-21: qty 5

## 2016-01-21 MED ORDER — BUPIVACAINE-EPINEPHRINE (PF) 0.5% -1:200000 IJ SOLN
INTRAMUSCULAR | Status: AC
  Filled 2016-01-21: qty 30

## 2016-01-21 MED ORDER — LACTATED RINGERS IV SOLN: INTRAVENOUS | Status: DC

## 2016-01-21 MED ORDER — IBUPROFEN 800 MG PO TABS: 800.0000 mg | ORAL_TABLET | Freq: Three times a day (TID) | ORAL | 1 refills | Status: DC | PRN

## 2016-01-21 MED ORDER — CEPHALEXIN 500 MG PO CAPS: 500.0000 mg | ORAL_CAPSULE | Freq: Four times a day (QID) | ORAL | 1 refills | Status: AC

## 2016-01-21 MED ORDER — EPINEPHRINE HCL 1 MG/ML IJ SOLN
INTRAMUSCULAR | Status: AC
  Filled 2016-01-21: qty 2

## 2016-01-21 MED ORDER — PHENYLEPHRINE HCL 10 MG/ML IJ SOLN
INTRAMUSCULAR | Status: DC | PRN
  Administered 2016-01-21: 80 ug via INTRAVENOUS
  Administered 2016-01-21: 40 ug via INTRAVENOUS

## 2016-01-21 MED ORDER — MIDAZOLAM HCL 2 MG/2ML IJ SOLN
INTRAMUSCULAR | Status: AC
  Filled 2016-01-21: qty 2

## 2016-01-21 MED ORDER — CEFAZOLIN SODIUM-DEXTROSE 2-4 GM/100ML-% IV SOLN
2.0000 g | INTRAVENOUS | Status: AC
  Administered 2016-01-21: 2 g via INTRAVENOUS
  Filled 2016-01-21: qty 100

## 2016-01-21 MED ORDER — PROPOFOL 10 MG/ML IV BOLUS
INTRAVENOUS | Status: DC | PRN
  Administered 2016-01-21: 120 mg via INTRAVENOUS

## 2016-01-21 MED ORDER — FENTANYL CITRATE (PF) 100 MCG/2ML IJ SOLN
25.0000 ug | INTRAMUSCULAR | Status: DC | PRN
  Administered 2016-01-21: 50 ug via INTRAVENOUS
  Administered 2016-01-21: 25 ug via INTRAVENOUS

## 2016-01-21 MED ORDER — OXYCODONE-ACETAMINOPHEN 5-325 MG PO TABS: 1.0000 | ORAL_TABLET | ORAL | 0 refills | Status: DC | PRN

## 2016-01-21 MED ORDER — SODIUM CHLORIDE 0.9 % IR SOLN
Status: DC | PRN
  Administered 2016-01-21: 500 mL

## 2016-01-21 MED ORDER — FENTANYL CITRATE (PF) 250 MCG/5ML IJ SOLN
INTRAMUSCULAR | Status: AC
Start: 2016-01-21 — End: 2016-01-21
  Filled 2016-01-21: qty 5

## 2016-01-21 MED ORDER — LACTATED RINGERS IR SOLN
Status: DC | PRN
  Administered 2016-01-21: 6000 mL

## 2016-01-21 MED ORDER — ONDANSETRON HCL 4 MG/2ML IJ SOLN
INTRAMUSCULAR | Status: AC
  Filled 2016-01-21: qty 2

## 2016-01-21 SURGICAL SUPPLY — 56 items
ANCHOR NDL 9/16 CIR SZ 8 (NEEDLE) IMPLANT
ANCHOR NEEDLE 9/16 CIR SZ 8 (NEEDLE) IMPLANT
BLADE 4.2CUDA (BLADE) IMPLANT
BLADE CUDA SHAVER 3.5 (BLADE) ×3 IMPLANT
BLADE SURG SZ11 CARB STEEL (BLADE) ×3 IMPLANT
BUR OVAL 4.0 (BURR) ×2 IMPLANT
CANNULA ACUFO 5X76 (CANNULA) ×3 IMPLANT
CANNULA SHOULDER 7CM (CANNULA) IMPLANT
CLOSURE WOUND 1/2 X4 (GAUZE/BANDAGES/DRESSINGS) ×1
CLOTH 2% CHLOROHEXIDINE 3PK (PERSONAL CARE ITEMS) ×3 IMPLANT
DRAPE STERI 35X30 U-POUCH (DRAPES) ×3 IMPLANT
DRSG AQUACEL AG ADV 3.5X 4 (GAUZE/BANDAGES/DRESSINGS) IMPLANT
DRSG AQUACEL AG ADV 3.5X 6 (GAUZE/BANDAGES/DRESSINGS) ×2 IMPLANT
DRSG PAD ABDOMINAL 8X10 ST (GAUZE/BANDAGES/DRESSINGS) IMPLANT
DURAPREP 26ML APPLICATOR (WOUND CARE) ×3 IMPLANT
ELECT NDL TIP 2.8 STRL (NEEDLE) IMPLANT
ELECT NEEDLE TIP 2.8 STRL (NEEDLE) IMPLANT
ELECT REM PT RETURN 9FT ADLT (ELECTROSURGICAL) ×3
ELECTRODE REM PT RTRN 9FT ADLT (ELECTROSURGICAL) ×1 IMPLANT
GLOVE BIOGEL PI IND STRL 7.0 (GLOVE) ×1 IMPLANT
GLOVE BIOGEL PI IND STRL 7.5 (GLOVE) IMPLANT
GLOVE BIOGEL PI INDICATOR 7.0 (GLOVE) ×6
GLOVE BIOGEL PI INDICATOR 7.5 (GLOVE) ×2
GLOVE SURG SS PI 7.0 STRL IVOR (GLOVE) ×3 IMPLANT
GLOVE SURG SS PI 7.5 STRL IVOR (GLOVE) ×3 IMPLANT
GLOVE SURG SS PI 8.0 STRL IVOR (GLOVE) ×6 IMPLANT
GOWN STRL REUS W/TWL LRG LVL3 (GOWN DISPOSABLE) ×2 IMPLANT
GOWN STRL REUS W/TWL XL LVL3 (GOWN DISPOSABLE) ×6 IMPLANT
KIT BASIN OR (CUSTOM PROCEDURE TRAY) IMPLANT
NDL SCORPION MULTI FIRE (NEEDLE) IMPLANT
NDL SPNL 18GX3.5 QUINCKE PK (NEEDLE) ×1 IMPLANT
NEEDLE SCORPION MULTI FIRE (NEEDLE) IMPLANT
NEEDLE SPNL 18GX3.5 QUINCKE PK (NEEDLE) ×3 IMPLANT
PACK SHOULDER (CUSTOM PROCEDURE TRAY) ×3 IMPLANT
SLING ARM FOAM STRAP LRG (SOFTGOODS) ×2 IMPLANT
STRIP CLOSURE SKIN 1/2X4 (GAUZE/BANDAGES/DRESSINGS) ×1 IMPLANT
SUCTION FRAZIER 12FR DISP (SUCTIONS) ×2 IMPLANT
SUCTION FRAZIER HANDLE 10FR (MISCELLANEOUS) ×2
SUCTION TUBE FRAZIER 10FR DISP (MISCELLANEOUS) IMPLANT
SUT BONE WAX W31G (SUTURE) IMPLANT
SUT ETHIBOND 2 OS 4 DA (SUTURE) IMPLANT
SUT ETHILON 4 0 PS 2 18 (SUTURE) ×3 IMPLANT
SUT FIBERWIRE #2 38 T-5 BLUE (SUTURE)
SUT PROLENE 3 0 PS 2 (SUTURE) ×2 IMPLANT
SUT TIGER TAPE 7 IN WHITE (SUTURE) IMPLANT
SUT VIC AB 1-0 CT2 27 (SUTURE) IMPLANT
SUT VIC AB 2-0 CT2 27 (SUTURE) ×2 IMPLANT
SUT VICRYL 0-0 OS 2 NEEDLE (SUTURE) ×3 IMPLANT
SUTURE FIBERWR #2 38 T-5 BLUE (SUTURE) IMPLANT
TAPE FIBER 2MM 7IN #2 BLUE (SUTURE) IMPLANT
TOWEL OR 17X26 10 PK STRL BLUE (TOWEL DISPOSABLE) ×3 IMPLANT
TOWEL OR NON WOVEN STRL DISP B (DISPOSABLE) IMPLANT
TUBING ARTHRO INFLOW-ONLY STRL (TUBING) ×3 IMPLANT
TUBING CONNECTING 10 (TUBING) ×1 IMPLANT
TUBING CONNECTING 10' (TUBING) ×1
WAND HAND CNTRL MULTIVAC 90 (MISCELLANEOUS) ×2 IMPLANT

## 2016-01-21 NOTE — Anesthesia Postprocedure Evaluation (Signed)
Anesthesia Post Note  Patient: LORENA ZINS  Procedure(s) Performed: Procedure(s) (LRB): LEFT SHOULDER ARTHROSCOPY SUBMACROMINIAL DECOMPRESSION WITH MINI  OPEN ROTATOR CUFF REPAIR (Left)  Patient location during evaluation: PACU Anesthesia Type: General Level of consciousness: awake and alert Pain management: pain level controlled Vital Signs Assessment: post-procedure vital signs reviewed and stable Respiratory status: spontaneous breathing, nonlabored ventilation and respiratory function stable Cardiovascular status: blood pressure returned to baseline and stable Postop Assessment: no signs of nausea or vomiting Anesthetic complications: no    Last Vitals:  Vitals:   01/21/16 1015 01/21/16 1027  BP: 124/79 117/70  Pulse: 76 71  Resp: 12 12  Temp: 36.5 C 36.6 C    Last Pain:  Vitals:   01/21/16 1027  TempSrc:   PainSc: 3                  Nilda Simmer

## 2016-01-21 NOTE — Interval H&P Note (Signed)
History and Physical Interval Note:  01/21/2016 7:17 AM  Richard Spencer  has presented today for surgery, with the diagnosis of LEFT ROTATOR CUFF TEAR  The various methods of treatment have been discussed with the patient and family. After consideration of risks, benefits and other options for treatment, the patient has consented to  Procedure(s): LEFT SHOULDER ARTHROSCOPY SUBMACROMINIAL DECOMPRESSION WITH MINI  OPEN ROTATOR CUFF REPAIR (Left) as a surgical intervention .  The patient's history has been reviewed, patient examined, no change in status, stable for surgery.  I have reviewed the patient's chart and labs.  Questions were answered to the patient's satisfaction.     Bita Cartwright C

## 2016-01-21 NOTE — Discharge Instructions (Signed)

## 2016-01-21 NOTE — Addendum Note (Signed)
Addendum  created 01/21/16 1048 by West Pugh, CRNA   Charge Capture section accepted

## 2016-01-21 NOTE — Brief Op Note (Signed)
01/21/2016  8:47 AM  PATIENT:  Richard Spencer  40 y.o. male  PRE-OPERATIVE DIAGNOSIS:  LEFT ROTATOR CUFF TEAR  POST-OPERATIVE DIAGNOSIS:  LEFT ROTATOR CUFF TEAR  PROCEDURE:  Procedure(s): LEFT SHOULDER ARTHROSCOPY SUBMACROMINIAL DECOMPRESSION WITH MINI  OPEN ROTATOR CUFF REPAIR (Left)  SURGEON:  Surgeon(s) and Role:    * [name], MD - Primary  PHYSICIAN ASSISTANT:   ASSISTANTS: Bissell   ANESTHESIA:   general  EBL:  Total I/O In: -  Out: 15 [Blood:15]  BLOOD ADMINISTERED:none  DRAINS: none   LOCAL MEDICATIONS USED:  MARCAINE     SPECIMEN:  No Specimen  DISPOSITION OF SPECIMEN:  N/A  COUNTS:  YES  TOURNIQUET:  * No tourniquets in log *  DICTATION: .Other Dictation: Dictation Number [id number]  PLAN OF CARE: Discharge to home after PACU  PATIENT DISPOSITION:  PACU - hemodynamically stable.   Delay start of Pharmacological VTE agent (>24hrs) due to surgical blood loss or risk of bleeding: no

## 2016-01-21 NOTE — Interval H&P Note (Signed)
History and Physical Interval Note:  01/21/2016 7:17 AM  Richard Spencer  has presented today for surgery, with the diagnosis of LEFT ROTATOR CUFF TEAR  The various methods of treatment have been discussed with the patient and family. After consideration of risks, benefits and other options for treatment, the patient has consented to  Procedure(s): LEFT SHOULDER ARTHROSCOPY SUBMACROMINIAL DECOMPRESSION WITH MINI  OPEN ROTATOR CUFF REPAIR (Left) as a surgical intervention .  The patient's history has been reviewed, patient examined, no change in status, stable for surgery.  I have reviewed the patient's chart and labs.  Questions were answered to the patient's satisfaction.     Lorane Cousar C

## 2016-01-21 NOTE — H&P (View-Only) (Signed)
Richard Spencer is an 40 y.o. male.   Chief Complaint: L shoulder pain HPI: The patient is a 40 year old male who presents today for follow up of their shoulder. The patient is being followed for their left shoulder pain. They are 4 weeks and 1 day out from when symptoms began. Symptoms reported today include: pain. and report their pain level to be 7 / 10. Current treatment includes: NSAIDs (Advil) and use of ice and heat prn. The following medication has been used for pain control: antiinflammatory medication. The patient presents today following MRI.  Richard Spencer follows up his MRI. He does have a T2 image coronal supraspinatus. Essentially a full thickness tear, some delamination. Bursitis is noted. He reports persistent pain.  Past Medical History:  Diagnosis Date  . Allergic rhinitis   . GERD (gastroesophageal reflux disease)   . Hypercholesterolemia   . Rotator cuff tear, right     Past Surgical History:  Procedure Laterality Date  . LUMBAR LAMINECTOMY  06/2010  . SHOULDER ARTHROSCOPY WITH OPEN ROTATOR CUFF REPAIR Right 08/08/2013   Procedure: RIGHT SHOULDER ARTHROSCOPY WITH MINI OPEN ROTATOR CUFF REPAIR AND SUBACROMIAL DECOMPRESSION ;  Surgeon: Richard Hai, MD;  Location: [facility];  Service: Orthopedics;  Laterality: Right;    Family History  Problem Relation Age of Onset  . Prostate cancer Father   . Coronary artery disease Father   . Heart attack Father   . Heart disease Father   . Hypertension Mother   . Rheum arthritis Maternal Grandmother   . Colon cancer Neg Hx    Social History:  reports that he quit smoking about 23 years ago. His smokeless tobacco use includes Snuff. He reports that he drinks alcohol. He reports that he does not use drugs.  Allergies:  Allergies  Allergen Reactions  . Erythromycin Other (See Comments)    GI upset     (Not in a hospital admission)  No results found for this or any previous visit (from the past 48 hour(s)). No results  found.  Review of Systems  Constitutional: Negative.   HENT: Negative.   Eyes: Negative.   Respiratory: Negative.   Cardiovascular: Negative.   Gastrointestinal: Negative.   Genitourinary: Negative.   Musculoskeletal: Positive for joint pain.  Skin: Negative.   Neurological: Negative.   Psychiatric/Behavioral: Negative.     There were no vitals taken for this visit. Physical Exam  Constitutional: He is oriented to person, place, and time. He appears well-developed.  HENT:  Head: Normocephalic.  Eyes: Pupils are equal, round, and reactive to light.  Neck: Normal range of motion.  Cardiovascular: Normal rate.   Respiratory: Effort normal.  GI: Soft.  Musculoskeletal:  On exam, he is tender in the anterior subacromial region. Positive drop arm sign. Weak in external rotation. Nontender AC. Inspection of the shoulder revealed no ecchymosis, soft tissue swelling or deformity. Provocative signs indicated no impingement sign, no sulcus sign, and negative speed's test. Negative lift off. Sensory exam was intact and motor function was normal in the deltoid and the rotator cuff.  Neurological: He is alert and oriented to person, place, and time.  Skin: Skin is warm and dry.    MRI reviewed T2 coronal, full-thickness tear and delamination.  Assessment/Plan Full-thickness tear of rotator cuff, delamination and slight retraction.  With this pain and presence of a tear, we discussed shoulder arthroscopy and mini open rotator cuff repair.  I had a long discussion with the patient concerning the risks  and benefits of a mini open rotator cuff repair, including bleeding, infection, prolonged postoperative recovery, which may require 3 to 5 months until maximum medical improvement. Overnight procedure with initiation of early passive range of motion within physical therapy. Avoid any active motion for the first six weeks. This is all in an effort to avoid recurrent tear of the rotator cuff and  adhesive capsulitis. Return to work without use of the arm can be obtained following two weeks. However, driving will be a challenge. We also discussed the possibility of requiring implants including bone anchors, as well as an Allograft patch graft if a massive rotator cuff tear is encountered. Removal of any bones for spurs as well as bursitis will be performed during the procedure and also any associated anesthetic complications as well.  Cannot do this until the end of August, we will have to schedule for that outpatient, general. Home with Toradol, gave him prescription for that. The side effect is discussed.  Plan L shoulder arthroscopy, SAD, mini-open RCR  Richard Spencer, [name], PA-C for Dr. Tonita Cong 12/24/2015, 1:36 PM

## 2016-01-21 NOTE — Transfer of Care (Signed)
Immediate Anesthesia Transfer of Care Note  Patient: Richard Spencer  Procedure(s) Performed: Procedure(s): LEFT SHOULDER ARTHROSCOPY SUBMACROMINIAL DECOMPRESSION WITH MINI  OPEN ROTATOR CUFF REPAIR (Left)  Patient Location: PACU  Anesthesia Type:General  Level of Consciousness:  sedated, patient cooperative and responds to stimulation  Airway & Oxygen Therapy:Patient Spontanous Breathing and Patient connected to face mask oxgen  Post-op Assessment:  Report given to PACU RN and Post -op Vital signs reviewed and stable  Post vital signs:  Reviewed and stable  Last Vitals:  Vitals:   01/21/16 0537  BP: 128/73  Pulse: 69  Resp: 16  Temp: 123XX123 C    Complications: No apparent anesthesia complications

## 2016-01-21 NOTE — Anesthesia Procedure Notes (Signed)
Procedure Name: Intubation Date/Time: 01/21/2016 7:34 AM Performed by: West Pugh Pre-anesthesia Checklist: Patient identified, Emergency Drugs available, Suction available, Patient being monitored and Timeout performed Patient Re-evaluated:Patient Re-evaluated prior to inductionOxygen Delivery Method: Circle system utilized Preoxygenation: Pre-oxygenation with 100% oxygen Intubation Type: IV induction Ventilation: Mask ventilation without difficulty Laryngoscope Size: Mac and 4 Grade View: Grade I Tube type: Oral Tube size: 7.5 mm Number of attempts: 1 Airway Equipment and Method: Stylet Placement Confirmation: ETT inserted through vocal cords under direct vision,  positive ETCO2,  CO2 detector and breath sounds checked- equal and bilateral Secured at: 22 cm Tube secured with: Tape Dental Injury: Teeth and Oropharynx as per pre-operative assessment

## 2016-01-22 NOTE — Op Note (Signed)
Richard Spencer, BRITO NO.:  [id number]  MEDICAL RECORD NO.:  [id number]  LOCATION:  [facility]                         FACILITY:  [facility]  PHYSICIAN:  [name], M.D.    DATE OF BIRTH:  05-Jan-1976  DATE OF PROCEDURE:  01/21/2016 DATE OF DISCHARGE:  01/21/2016                              OPERATIVE REPORT   PREOPERATIVE DIAGNOSIS:  Rotator cuff tear, impingement syndrome, left shoulder.  POSTOPERATIVE DIAGNOSIS:  Rotator cuff tear, impingement syndrome, left shoulder.  PROCEDURE PERFORMED: 1. Examination under anesthesia. 2. Left shoulder arthroscopy, subacromial decompression, acromioplasty     and resection of coracoacromial ligament, bursectomy. 3. Mini open rotator cuff repair, side-to-side rotator cuff tear, 1-cm     length.  ANESTHESIA:  General.  ASSISTANT:  Cleophas Dunker, PA.  HISTORY:  This is a 40 year old with persistent shoulder pain, history of an injury, impingement syndrome, temporary relief of cortical steroid injections and MRI indicating over 50% thickness tear in the supraspinatus at its insertion.  No retraction, minimal degenerative changes of the labrum.  He was indicated for shoulder arthroscopy, subacromial decompression, mini open rotator cuff repair, been refractory to conservative treatment, risks and benefits discussed including bleeding, infection, damage to neurovascular structures, suboptimal range of motion, DVT, PE, anesthetic complications, etc.  TECHNIQUE:  With the patient in supine, beach-chair position, after induction of adequate general anesthesia, 2 g of Kefzol and was placed upright beach-chair position, head and neck secured in neutral fashion. Left shoulder and upper extremity were prepped and draped in usual sterile fashion.  Exam under anesthesia revealed an abduction and slight internal rotation.  There was some slight posterior instability, not excessive.  Remainder of motion was unremarkable.  I used the  surgical marker to delineate the acromion and AC joint.  A standard posterolateral portal was utilized with incision through the skin only and the #11 blade.  I introduced the camera into the subacromial space. Hypertrophic synovitis was noted throughout the subacromial space and an enlarged, thickened coracoacromial ligament.  Hypertrophic bursa.  I did detect tearing at the area noted on the MRI in the supraspinatus with palpation greater than 50%.  I performed a subacromial decompression, performed a bursectomy with 3.5 shaver and ArthroWand to morselize and release the CA ligament and shaving the anterolateral aspect of the acromion.  The remainder of the cuff was unremarkable.  I therefore converted to the mini-open rotator cuff repair.  We did use a second portal anterolaterally through the skin only and triangulating the subacromial space using that portal for our working portal.  Following this removal, we repaired the portals with 4-0 nylon, and then made a small 2-cm incision over the anterolateral aspect of the acromion. Through the raphe, the anterolateral heads, this was divided in line with the skin incision.  We used a Nurse, mental health.  Entered the subacromial space, digitally lysed subacromial adhesions.  I then found the area of the anterior-superior footplate of the supraspinatus with that partial tear.  I roughened the bone up with AO elevator and a small penetration with a small awl and felt side-to-side repair would be satisfactory.  This was only a 0.5-1 cm in length, and no more  longitudinal as opposed to retraction from the greater tuberosity.  I repaired side-to-side with 0 and 1 Vicryl interrupted figure-of-eight sutures for an excellent closure, watertight.  The remainder of the cuff was unremarkable, residual bursa removed.  We repaired the raphe with 0 Vicryl, subcu with 2-0 and skin with Prolene.  Sterile dressing applied. Placed in a sling, extubated without  difficulty, and transported to the recovery room in satisfactory condition.  The patient tolerated the procedure well.  No complications.  Assistant, Cleophas Dunker, [place].  Minimal blood loss.  Geni Bers Bissell's assistance was used throughout the case for patient positioning, gentle intermittent neural traction of the upper extremity and closure.     [name], M.D.     Geralynn Rile  D:  01/21/2016  T:  01/22/2016  Job:  [id number]

## 2018-07-13 NOTE — Progress Notes (Signed)
Subjective:    Patient ID: Richard Spencer, male    DOB: Jul 30, 1975, 43 y.o.   MRN: [id number]  HPI:  Richard Spencer is here to establish as a new pt.  He is a pleasant 43 year old male. SPQ:ZRAQ, Chronic Cervical Neck Pain, Chronic HAs, Anal Fissures that have been recently bleeding, Nocturia (father dx'd with prostate ca in his 60s). He has never had PCP, used UCs in past for medical needs He estimates to drink 40-70 oz water/day, also drinks several energy drinks/day He uses smokeless tobacco He denies regular exercise, however recently purchased home "Bowflex" machine. He occasionally uses OTC Nexium and Ibuprofen for GERD and HAs He has never been seen by GI, agreeable to referral   Patient Care Team    Relationship Specialty Notifications Start End  [name], NP PCP - General Family Medicine  07/17/18   [name], MD Consulting Physician Orthopedic Surgery  07/17/18   [name], MD Consulting Physician Urology  07/17/18     Patient Active Problem List   Diagnosis Date Noted  . Healthcare maintenance 07/17/2018  . Anal fissure 07/17/2018  . Family history of malignant neoplasm of prostate 07/17/2018  . Rotator cuff tear, right 08/08/2013  . Esophageal reflux 10/24/2011     Past Medical History:  Diagnosis Date  . Allergic rhinitis   . GERD (gastroesophageal reflux disease)   . Hypercholesterolemia   . Rotator cuff tear, right      Past Surgical History:  Procedure Laterality Date  . LUMBAR LAMINECTOMY  06/2010  . SHOULDER ARTHROSCOPY WITH OPEN ROTATOR CUFF REPAIR Right 08/08/2013   Procedure: RIGHT SHOULDER ARTHROSCOPY WITH MINI OPEN ROTATOR CUFF REPAIR AND SUBACROMIAL DECOMPRESSION ;  Surgeon: [name], MD;  Location: [facility];  Service: Orthopedics;  Laterality: Right;  . SHOULDER ARTHROSCOPY WITH OPEN ROTATOR CUFF REPAIR AND DISTAL CLAVICLE ACROMINECTOMY Left 01/21/2016   Procedure: LEFT SHOULDER ARTHROSCOPY SUBMACROMINIAL DECOMPRESSION WITH MINI   OPEN ROTATOR CUFF REPAIR;  Surgeon: [name], MD;  Location: [facility];  Service: Orthopedics;  Laterality: Left;  [name] VASECTOMY       Family History  Problem Relation Age of Onset  . Prostate cancer Father   . Coronary artery disease Father   . Heart attack Father   . Heart disease Father   . Diabetes Father   . Hyperlipidemia Father   . Hypertension Mother   . Hyperlipidemia Mother   . Rheum arthritis Maternal Grandmother   . Hyperlipidemia Brother   . Colon cancer Neg Hx      Social History   Substance and Sexual Activity  Drug Use No     Social History   Substance and Sexual Activity  Alcohol Use Yes  . Alcohol/week: 12.0 standard drinks  . Types: 12 Cans of beer per week     Social History   Tobacco Use  Smoking Status Former Smoker  . Packs/day: 1.00  . Years: 4.00  . Pack years: 4.00  . Types: Cigarettes  . Last attempt to quit: 08/05/1992  . Years since quitting: 25.9  Smokeless Tobacco Current User  . Types: Snuff     Outpatient Encounter Medications as of 07/17/2018  Medication Sig  . ibuprofen (ADVIL,MOTRIN) 200 MG tablet Take 2-4 tablets (400-800 mg total) by mouth every 6 (six) hours as needed for mild pain or moderate pain. Resume after toradol is complete, do not take with toradol  . omeprazole (PRILOSEC OTC) 20 MG tablet Take 20 mg by  mouth daily.  . [DISCONTINUED] acetaminophen (TYLENOL) 500 MG tablet Take 500 mg by mouth every 6 (six) hours as needed.  . [DISCONTINUED] aspirin EC 325 MG tablet Take 1 tablet (325 mg total) by mouth daily.  . [DISCONTINUED] docusate sodium (COLACE) 100 MG capsule Take 1 capsule (100 mg total) by mouth 2 (two) times daily as needed for mild constipation.  . [DISCONTINUED] ibuprofen (ADVIL,MOTRIN) 800 MG tablet Take 1 tablet (800 mg total) by mouth 3 (three) times daily with meals as needed.  . [DISCONTINUED] methocarbamol (ROBAXIN) 500 MG tablet Take 1 tablet (500 mg total) by mouth every 6 (six) hours as  needed for muscle spasms.  . [DISCONTINUED] oxyCODONE-acetaminophen (PERCOCET) 5-325 MG tablet Take 1-2 tablets by mouth every 4 (four) hours as needed for severe pain.   No facility-administered encounter medications on file as of 07/17/2018.     Allergies: Erythromycin  There is no height or weight on file to calculate BMI.  Blood pressure 116/73, pulse 68.  Review of Systems  Constitutional: Positive for fatigue. Negative for activity change, appetite change, chills, diaphoresis, fever and unexpected weight change.  HENT: Negative for congestion.   Eyes: Negative for visual disturbance.  Respiratory: Negative for cough, chest tightness, shortness of breath, wheezing and stridor.   Cardiovascular: Negative for chest pain, palpitations and leg swelling.  Gastrointestinal: Positive for anal bleeding. Negative for abdominal distention, abdominal pain, blood in stool, constipation, diarrhea, nausea, rectal pain and vomiting.  Endocrine: Negative for cold intolerance, heat intolerance, polydipsia, polyphagia and polyuria.  Genitourinary: Negative for difficulty urinating, flank pain and frequency.       Nocturia- 2 times/night  Musculoskeletal: Positive for arthralgias, myalgias, neck pain and neck stiffness. Negative for back pain, gait problem and joint swelling.  Neurological: Negative for dizziness and headaches.  Hematological: Does not bruise/bleed easily.  Psychiatric/Behavioral: Negative for agitation, behavioral problems, confusion, decreased concentration, dysphoric mood, hallucinations, self-injury, sleep disturbance and suicidal ideas. The patient is not nervous/anxious and is not hyperactive.        Objective:   Physical Exam Vitals signs and nursing note reviewed.  Constitutional:      General: He is not in acute distress.    Appearance: He is not ill-appearing, toxic-appearing or diaphoretic.  HENT:     Head: Normocephalic and atraumatic.  Eyes:     Extraocular  Movements: Extraocular movements intact.     Conjunctiva/sclera: Conjunctivae normal.     Pupils: Pupils are equal, round, and reactive to light.  Cardiovascular:     Rate and Rhythm: Normal rate.     Pulses: Normal pulses.  Pulmonary:     Effort: Pulmonary effort is normal. No respiratory distress.     Breath sounds: No stridor. No wheezing, rhonchi or rales.  Chest:     Chest wall: No tenderness.  Skin:    Capillary Refill: Capillary refill takes less than 2 seconds.  Neurological:     Mental Status: He is alert and oriented to person, place, and time.  Psychiatric:        Mood and Affect: Mood normal.        Behavior: Behavior normal.        Thought Content: Thought content normal.        Judgment: Judgment normal.       Assessment & Plan:   1. Healthcare maintenance   2. Need for influenza vaccination   3. Family history of diabetes mellitus in father   14. Anal fissure   5.  Family history of malignant neoplasm of prostate   6. Nocturia     Healthcare maintenance Referral to Gastroenterology placed, re: Anal Fissures. Increase plain water intake and follow Mediterranean Diet. Increase regular exercise.  Recommend at least 30 minutes daily, 5 days per week of walking, jogging, biking, swimming, YouTube/Pinterest workout videos. Please schedule complete physical in 2 months, fasting lab appt the week prior.  Anal fissure Referral to Gastroenterology placed, re: Anal Fissures. Increase plain water intake and follow Mediterranean Diet. Increase regular exercise.  Recommend at least 30 minutes daily, 5 days per week of walking, jogging, biking, swimming, YouTube/Pinterest workout videos. Please schedule complete physical in 2 months, fasting lab appt the week prior.  Family history of malignant neoplasm of prostate Will check PSA at CPE     FOLLOW-UP:  Return in about 2 months (around 09/15/2018) for CPE, Fasting Labs.

## 2018-07-17 ENCOUNTER — Ambulatory Visit (INDEPENDENT_AMBULATORY_CARE_PROVIDER_SITE_OTHER): Payer: No Typology Code available for payment source | Admitting: Adult Health

## 2018-07-17 ENCOUNTER — Encounter: Payer: Self-pay | Admitting: Adult Health

## 2018-07-17 VITALS — BP 116/73 | HR 68

## 2018-07-17 DIAGNOSIS — K602 Anal fissure, unspecified: Secondary | ICD-10-CM | POA: Diagnosis not present

## 2018-07-17 DIAGNOSIS — Z833 Family history of diabetes mellitus: Secondary | ICD-10-CM | POA: Diagnosis not present

## 2018-07-17 DIAGNOSIS — Z0000 Encounter for general adult medical examination without abnormal findings: Secondary | ICD-10-CM | POA: Diagnosis not present

## 2018-07-17 DIAGNOSIS — Z8042 Family history of malignant neoplasm of prostate: Secondary | ICD-10-CM | POA: Insufficient documentation

## 2018-07-17 DIAGNOSIS — Z23 Encounter for immunization: Secondary | ICD-10-CM | POA: Diagnosis not present

## 2018-07-17 DIAGNOSIS — R351 Nocturia: Secondary | ICD-10-CM

## 2018-07-17 NOTE — Assessment & Plan Note (Signed)
Referral to Gastroenterology placed, re: Anal Fissures. Increase plain water intake and follow Mediterranean Diet. Increase regular exercise.  Recommend at least 30 minutes daily, 5 days per week of walking, jogging, biking, swimming, YouTube/Pinterest workout videos. Please schedule complete physical in 2 months, fasting lab appt the week prior.

## 2018-07-17 NOTE — Assessment & Plan Note (Signed)
Will check PSA at CPE

## 2018-07-17 NOTE — Patient Instructions (Signed)
Mediterranean Diet A Mediterranean diet refers to food and lifestyle choices that are based on the traditions of countries located on the [employer]. This way of eating has been shown to help prevent certain conditions and improve outcomes for people who have chronic diseases, like kidney disease and heart disease. What are tips for following this plan? Lifestyle  Cook and eat meals together with your family, when possible.  Drink enough fluid to keep your urine clear or pale yellow.  Be physically active every day. This includes: ? Aerobic exercise like running or swimming. ? Leisure activities like gardening, walking, or housework.  Get 7-8 hours of sleep each night.  If recommended by your health care provider, drink red wine in moderation. This means 1 glass a day for nonpregnant women and 2 glasses a day for men. A glass of wine equals 5 oz (150 mL). Reading food labels   Check the serving size of packaged foods. For foods such as rice and pasta, the serving size refers to the amount of cooked product, not dry.  Check the total fat in packaged foods. Avoid foods that have saturated fat or trans fats.  Check the ingredients list for added sugars, such as corn syrup. Shopping  At the grocery store, buy most of your food from the areas near the walls of the store. This includes: ? Fresh fruits and vegetables (produce). ? Grains, beans, nuts, and seeds. Some of these may be available in unpackaged forms or large amounts (in bulk). ? Fresh seafood. ? Poultry and eggs. ? Low-fat dairy products.  Buy whole ingredients instead of prepackaged foods.  Buy fresh fruits and vegetables in-season from local farmers markets.  Buy frozen fruits and vegetables in resealable bags.  If you do not have access to quality fresh seafood, buy precooked frozen shrimp or canned fish, such as tuna, salmon, or sardines.  Buy small amounts of raw or cooked vegetables, salads, or olives from  the deli or salad bar at your store.  Stock your pantry so you always have certain foods on hand, such as olive oil, canned tuna, canned tomatoes, rice, pasta, and beans. Cooking  Cook foods with extra-virgin olive oil instead of using butter or other vegetable oils.  Have meat as a side dish, and have vegetables or grains as your main dish. This means having meat in small portions or adding small amounts of meat to foods like pasta or stew.  Use beans or vegetables instead of meat in common dishes like chili or lasagna.  Experiment with different cooking methods. Try roasting or broiling vegetables instead of steaming or sauteing them.  Add frozen vegetables to soups, stews, pasta, or rice.  Add nuts or seeds for added healthy fat at each meal. You can add these to yogurt, salads, or vegetable dishes.  Marinate fish or vegetables using olive oil, lemon juice, garlic, and fresh herbs. Meal planning   Plan to eat 1 vegetarian meal one day each week. Try to work up to 2 vegetarian meals, if possible.  Eat seafood 2 or more times a week.  Have healthy snacks readily available, such as: ? Vegetable sticks with hummus. ? [place] yogurt. ? Fruit and nut trail mix.  Eat balanced meals throughout the week. This includes: ? Fruit: 2-3 servings a day ? Vegetables: 4-5 servings a day ? Low-fat dairy: 2 servings a day ? Fish, poultry, or lean meat: 1 serving a day ? Beans and legumes: 2 or more servings a week ?  Nuts and seeds: 1-2 servings a day ? Whole grains: 6-8 servings a day ? Extra-virgin olive oil: 3-4 servings a day  Limit red meat and sweets to only a few servings a month What are my food choices?  Mediterranean diet ? Recommended ? Grains: Whole-grain pasta. Brown rice. Bulgar wheat. Polenta. Couscous. Whole-wheat bread. [place]. ? Vegetables: Artichokes. Beets. Broccoli. Cabbage. Carrots. Eggplant. Green beans. Chard. Kale. Spinach. Onions. Leeks. Peas. Squash.  Tomatoes. Peppers. Radishes. ? Fruits: Apples. Apricots. Avocado. Berries. Bananas. Cherries. Dates. Figs. Grapes. Lemons. Melon. Oranges. Peaches. Plums. Pomegranate. ? Meats and other protein foods: Beans. Almonds. Sunflower seeds. Pine nuts. Peanuts. [place]. Salmon. Scallops. Shrimp. [place]. Tilapia. Clams. Oysters. Eggs. ? Dairy: Low-fat milk. Cheese. Greek yogurt. ? Beverages: Water. Red wine. Herbal tea. ? Fats and oils: Extra virgin olive oil. Avocado oil. Grape seed oil. ? Sweets and desserts: [place] yogurt with honey. Baked apples. Poached pears. Trail mix. ? Seasoning and other foods: Basil. Cilantro. Coriander. Cumin. Mint. Parsley. Sage. Rosemary. Tarragon. Garlic. Oregano. Thyme. Pepper. Balsalmic vinegar. Tahini. Hummus. Tomato sauce. Olives. Mushrooms. ? Limit these ? Grains: Prepackaged pasta or rice dishes. Prepackaged cereal with added sugar. ? Vegetables: Deep fried potatoes (french fries). ? Fruits: Fruit canned in syrup. ? Meats and other protein foods: Beef. Pork. Lamb. Poultry with skin. Hot dogs. [place] Salines. ? Dairy: Ice cream. Sour cream. Whole milk. ? Beverages: Juice. Sugar-sweetened soft drinks. Beer. Liquor and spirits. ? Fats and oils: Butter. Canola oil. Vegetable oil. Beef fat (tallow). Lard. ? Sweets and desserts: Cookies. Cakes. Pies. Candy. ? Seasoning and other foods: Mayonnaise. Premade sauces and marinades. ? The items listed may not be a complete list. Talk with your dietitian about what dietary choices are right for you. Summary  The Mediterranean diet includes both food and lifestyle choices.  Eat a variety of fresh fruits and vegetables, beans, nuts, seeds, and whole grains.  Limit the amount of red meat and sweets that you eat.  Talk with your health care provider about whether it is safe for you to drink red wine in moderation. This means 1 glass a day for nonpregnant women and 2 glasses a day for men. A glass of wine equals 5 oz (150 mL). This information  is not intended to replace advice given to you by your health care provider. Make sure you discuss any questions you have with your health care provider. Document Released: 12/31/2015 Document Revised: 02/02/2016 Document Reviewed: 12/31/2015 Elsevier Interactive Patient Education  2019 [place].  Referral to Gastroenterology placed, re: Anal Fissures. Increase plain water intake and follow Mediterranean Diet. Increase regular exercise.  Recommend at least 30 minutes daily, 5 days per week of walking, jogging, biking, swimming, YouTube/Pinterest workout videos. Please schedule complete physical in 2 months, fasting lab appt the week prior. WELCOME TO THE PRACTICE!

## 2018-09-05 ENCOUNTER — Other Ambulatory Visit: Payer: No Typology Code available for payment source

## 2018-09-12 ENCOUNTER — Encounter: Payer: No Typology Code available for payment source | Admitting: Adult Health

## 2018-12-10 ENCOUNTER — Other Ambulatory Visit (INDEPENDENT_AMBULATORY_CARE_PROVIDER_SITE_OTHER): Payer: No Typology Code available for payment source

## 2018-12-10 ENCOUNTER — Other Ambulatory Visit: Payer: Self-pay

## 2018-12-10 ENCOUNTER — Other Ambulatory Visit: Payer: No Typology Code available for payment source

## 2018-12-10 DIAGNOSIS — Z833 Family history of diabetes mellitus: Secondary | ICD-10-CM

## 2018-12-10 DIAGNOSIS — Z8042 Family history of malignant neoplasm of prostate: Secondary | ICD-10-CM

## 2018-12-10 DIAGNOSIS — R351 Nocturia: Secondary | ICD-10-CM

## 2018-12-10 DIAGNOSIS — Z0000 Encounter for general adult medical examination without abnormal findings: Secondary | ICD-10-CM

## 2018-12-11 LAB — COMPREHENSIVE METABOLIC PANEL
ALT: 18 IU/L (ref 0–44)
AST: 19 IU/L (ref 0–40)
Albumin/Globulin Ratio: 2.3 — ABNORMAL HIGH (ref 1.2–2.2)
Albumin: 4.5 g/dL (ref 4.0–5.0)
Alkaline Phosphatase: 44 IU/L (ref 39–117)
BUN/Creatinine Ratio: 8 — ABNORMAL LOW (ref 9–20)
BUN: 9 mg/dL (ref 6–24)
Bilirubin Total: 0.7 mg/dL (ref 0.0–1.2)
CO2: 23 mmol/L (ref 20–29)
Calcium: 9.7 mg/dL (ref 8.7–10.2)
Chloride: 101 mmol/L (ref 96–106)
Creatinine, Ser: 1.2 mg/dL (ref 0.76–1.27)
GFR calc Af Amer: 85 mL/min/{1.73_m2} (ref >59)
GFR calc non Af Amer: 73 mL/min/{1.73_m2} (ref >59)
Globulin, Total: 2 g/dL (ref 1.5–4.5)
Glucose: 75 mg/dL (ref 65–99)
Potassium: 4.1 mmol/L (ref 3.5–5.2)
Sodium: 143 mmol/L (ref 134–144)
Total Protein: 6.5 g/dL (ref 6.0–8.5)

## 2018-12-11 LAB — CBC WITH DIFFERENTIAL/PLATELET
Basophils Absolute: 0 10*3/uL (ref 0.0–0.2)
Basos: 1 %
EOS (ABSOLUTE): 0.1 10*3/uL (ref 0.0–0.4)
Eos: 2 %
Hematocrit: 44.5 % (ref 37.5–51.0)
Hemoglobin: 15.5 g/dL (ref 13.0–17.7)
Immature Grans (Abs): 0 10*3/uL (ref 0.0–0.1)
Immature Granulocytes: 0 %
Lymphocytes Absolute: 2.3 10*3/uL (ref 0.7–3.1)
Lymphs: 41 %
MCH: 31.6 pg (ref 26.6–33.0)
MCHC: 34.8 g/dL (ref 31.5–35.7)
MCV: 91 fL (ref 79–97)
Monocytes Absolute: 0.3 10*3/uL (ref 0.1–0.9)
Monocytes: 5 %
Neutrophils Absolute: 2.8 10*3/uL (ref 1.4–7.0)
Neutrophils: 51 %
Platelets: 272 10*3/uL (ref 150–450)
RBC: 4.9 x10E6/uL (ref 4.14–5.80)
RDW: 13 % (ref 11.6–15.4)
WBC: 5.5 10*3/uL (ref 3.4–10.8)

## 2018-12-11 LAB — LIPID PANEL
Chol/HDL Ratio: 3.2 ratio (ref 0.0–5.0)
Cholesterol, Total: 208 mg/dL — ABNORMAL HIGH (ref 100–199)
HDL: 65 mg/dL (ref >39)
LDL Calculated: 120 mg/dL — ABNORMAL HIGH (ref 0–99)
Triglycerides: 115 mg/dL (ref 0–149)
VLDL Cholesterol Cal: 23 mg/dL (ref 5–40)

## 2018-12-11 LAB — HEMOGLOBIN A1C
Est. average glucose Bld gHb Est-mCnc: 94 mg/dL
Hgb A1c MFr Bld: 4.9 % (ref 4.8–5.6)

## 2018-12-11 LAB — TSH: TSH: 2.09 u[IU]/mL (ref 0.450–4.500)

## 2018-12-11 LAB — PSA: Prostate Specific Ag, Serum: 6.8 ng/mL — ABNORMAL HIGH (ref 0.0–4.0)

## 2018-12-11 NOTE — Progress Notes (Signed)
Subjective:    Patient ID: Richard Spencer, male    DOB: 1976/02/25, 42 y.o.   MRN: [id number]  HPI:  [name] is here for CPE He has increased daily water intake- now consuming >65 oz/day He has completely eliminated fried foods, but will still enjoy eggs, cheese, and red meat He is quite active with yard/house work He reports occasional urinary dribbling, denies hematuria His father was in his later 50s/early 76s when he was dx'd with prostate ca- treated with prostatectomy  Labs 12/10/2018 OV: PSA-6.8, hx of Prostate Ca- will refer to Urology  TSH-WNL-2.090 The 10-year ASCVD risk score [name] DC [name]., et al., 2013) is: 1.2%   Values used to calculate the score:     Age: 32 years     Sex: Male     Is Non-Hispanic African American: No     Diabetic: No     Tobacco smoker: No     Systolic Blood Pressure: 867 mmHg     Is BP treated: No     HDL Cholesterol: 65 mg/dL     Total Cholesterol: 208 mg/dL LDL-120 A1c-WNL, 4.9 CMP-stable CBC-stable  Healthcare Maintenance: Immunizations-UTD   Patient Care Team    Relationship Specialty Notifications Start End  [name], NP PCP - General Family Medicine  07/17/18   [name], [place] Physician Orthopedic Surgery  07/17/18   [name], MD Consulting Physician Urology  07/17/18     Patient Active Problem List   Diagnosis Date Noted  . Elevated LDL cholesterol level 12/13/2018  . Healthcare maintenance 07/17/2018  . Anal fissure 07/17/2018  . Family history of malignant neoplasm of prostate 07/17/2018  . Rotator cuff tear, right 08/08/2013  . Esophageal reflux 10/24/2011     Past Medical History:  Diagnosis Date  . Allergic rhinitis   . GERD (gastroesophageal reflux disease)   . Hypercholesterolemia   . Rotator cuff tear, right      Past Surgical History:  Procedure Laterality Date  . LUMBAR LAMINECTOMY  06/2010  . SHOULDER ARTHROSCOPY WITH OPEN ROTATOR CUFF REPAIR Right 08/08/2013   Procedure:  RIGHT SHOULDER ARTHROSCOPY WITH MINI OPEN ROTATOR CUFF REPAIR AND SUBACROMIAL DECOMPRESSION ;  Surgeon: [name], MD;  Location: [facility];  Service: Orthopedics;  Laterality: Right;  . SHOULDER ARTHROSCOPY WITH OPEN ROTATOR CUFF REPAIR AND DISTAL CLAVICLE ACROMINECTOMY Left 01/21/2016   Procedure: LEFT SHOULDER ARTHROSCOPY SUBMACROMINIAL DECOMPRESSION WITH MINI  OPEN ROTATOR CUFF REPAIR;  Surgeon: [name], MD;  Location: [facility];  Service: Orthopedics;  Laterality: Left;  [name] VASECTOMY       Family History  Problem Relation Age of Onset  . Prostate cancer Father   . Coronary artery disease Father   . Heart attack Father   . Heart disease Father   . Diabetes Father   . Hyperlipidemia Father   . Hypertension Mother   . Hyperlipidemia Mother   . Rheum arthritis Maternal Grandmother   . Hyperlipidemia Brother   . Colon cancer Neg Hx      Social History   Substance and Sexual Activity  Drug Use No     Social History   Substance and Sexual Activity  Alcohol Use Yes  . Alcohol/week: 12.0 standard drinks  . Types: 12 Cans of beer per week     Social History   Tobacco Use  Smoking Status Former Smoker  . Packs/day: 1.00  . Years: 4.00  . Pack years: 4.00  . Types: Cigarettes  .  Quit date: 08/05/1992  . Years since quitting: 26.3  Smokeless Tobacco Current User  . Types: Snuff     Outpatient Encounter Medications as of 12/13/2018  Medication Sig  . ibuprofen (ADVIL,MOTRIN) 200 MG tablet Take 2-4 tablets (400-800 mg total) by mouth every 6 (six) hours as needed for mild pain or moderate pain. Resume after toradol is complete, do not take with toradol  . omeprazole (PRILOSEC OTC) 20 MG tablet Take 20 mg by mouth daily.  [name] OVER THE COUNTER MEDICATION CBD Oil   No facility-administered encounter medications on file as of 12/13/2018.     Allergies: Erythromycin  Body mass index is 24.06 kg/m.  Blood pressure 107/76, pulse 73, temperature 98.4 F (36.9 C),  temperature source Oral, height 5' 5.25" (1.657 m), weight 145 lb 11.2 oz (66.1 kg), SpO2 99 %.  Review of Systems  Constitutional: Positive for fatigue. Negative for activity change, appetite change, chills, diaphoresis, fever and unexpected weight change.  HENT: Negative for congestion.   Eyes: Negative for visual disturbance.  Respiratory: Negative for cough, chest tightness, shortness of breath, wheezing and stridor.   Cardiovascular: Negative for chest pain, palpitations and leg swelling.  Gastrointestinal: Negative for abdominal distention, abdominal pain, anal bleeding, blood in stool, constipation, diarrhea, nausea, rectal pain and vomiting.  Endocrine: Negative for cold intolerance, heat intolerance, polydipsia, polyphagia and polyuria.  Genitourinary: Negative for difficulty urinating, frequency and hematuria.       Dribbling +  Musculoskeletal: Negative for arthralgias, back pain, gait problem, joint swelling, myalgias, neck pain and neck stiffness.  Skin: Negative for color change, pallor, rash and wound.  Neurological: Negative for dizziness and headaches.  Hematological: Negative for adenopathy. Does not bruise/bleed easily.  Psychiatric/Behavioral: Negative for agitation, behavioral problems, confusion, decreased concentration, dysphoric mood, hallucinations, self-injury, sleep disturbance and suicidal ideas. The patient is not nervous/anxious and is not hyperactive.        Objective:   Physical Exam Vitals signs and nursing note reviewed.  Constitutional:      General: He is not in acute distress.    Appearance: Normal appearance. He is normal weight. He is not ill-appearing, toxic-appearing or diaphoretic.  HENT:     Head: Normocephalic and atraumatic.     Right Ear: Tympanic membrane, ear canal and external ear normal. There is no impacted cerumen.     Left Ear: Tympanic membrane, ear canal and external ear normal. There is no impacted cerumen.     Nose: Nose normal.  No congestion.     Mouth/Throat:     Mouth: Mucous membranes are moist.     Pharynx: No oropharyngeal exudate.  Eyes:     Extraocular Movements: Extraocular movements intact.     Conjunctiva/sclera: Conjunctivae normal.     Pupils: Pupils are equal, round, and reactive to light.  Neck:     Musculoskeletal: Normal range of motion. No muscular tenderness.  Cardiovascular:     Rate and Rhythm: Normal rate and regular rhythm.     Pulses: Normal pulses.     Heart sounds: Normal heart sounds. No murmur. No friction rub. No gallop.   Pulmonary:     Effort: Pulmonary effort is normal. No respiratory distress.     Breath sounds: Normal breath sounds. No stridor. No wheezing, rhonchi or rales.  Chest:     Chest wall: No tenderness.  Abdominal:     General: Abdomen is flat. Bowel sounds are normal. There is no distension.     Palpations: Abdomen is soft.  There is no mass.     Tenderness: There is no abdominal tenderness. There is no right CVA tenderness, left CVA tenderness, guarding or rebound.     Hernia: No hernia is present.  Genitourinary:    Comments: Deferred rectal/genital examination to Urology  Musculoskeletal: Normal range of motion.        General: No swelling or tenderness.  Skin:    General: Skin is warm and dry.     Capillary Refill: Capillary refill takes less than 2 seconds.  Neurological:     Mental Status: He is alert and oriented to person, place, and time.     Coordination: Coordination normal.  Psychiatric:        Mood and Affect: Mood normal.        Thought Content: Thought content normal.        Judgment: Judgment normal.        Assessment & Plan:   1. Elevated PSA   2. Family history of prostate cancer in father   26. Healthcare maintenance   4. Family history of malignant neoplasm of prostate   5. Elevated LDL cholesterol level     Healthcare maintenance Referral to Urology placed, re: Elevated PSA and family history of Prostate cancer. Other labs  were stable with just minor elevations in total and LDL (bad) cholesterol. Recommend reducing saturated fat by 25% to help normalize. Continue your active lifestyle and try to reduce to stop smokeless use- you can do it! Continue to social distance and wear a mask when in public. Recommend annual physical with fasting labs the week prior.  Family history of malignant neoplasm of prostate PSA- 6.8 Reports occasional urinary dribbling Referral to Urology placed.  Elevated LDL cholesterol level The 10-year ASCVD risk score [name] DC Jr., et al., 2013) is: 1%   Values used to calculate the score:     Age: 20 years     Sex: Male     Is Non-Hispanic African American: No     Diabetic: No     Tobacco smoker: No     Systolic Blood Pressure: 509 mmHg     Is BP treated: No     HDL Cholesterol: 65 mg/dL     Total Cholesterol: 208 mg/dL LDL-120 Advised to reduce saturated fat intake Remain as active as possible     FOLLOW-UP:  Return in about 1 year (around 12/13/2019) for CPE, Fasting Labs.

## 2018-12-13 ENCOUNTER — Ambulatory Visit (INDEPENDENT_AMBULATORY_CARE_PROVIDER_SITE_OTHER): Payer: No Typology Code available for payment source | Admitting: Adult Health

## 2018-12-13 ENCOUNTER — Other Ambulatory Visit: Payer: Self-pay

## 2018-12-13 ENCOUNTER — Encounter: Payer: Self-pay | Admitting: Adult Health

## 2018-12-13 VITALS — BP 107/76 | HR 73 | Temp 98.4°F | Ht 65.25 in | Wt 145.7 lb

## 2018-12-13 DIAGNOSIS — R972 Elevated prostate specific antigen [PSA]: Secondary | ICD-10-CM | POA: Diagnosis not present

## 2018-12-13 DIAGNOSIS — Z8042 Family history of malignant neoplasm of prostate: Secondary | ICD-10-CM

## 2018-12-13 DIAGNOSIS — Z0000 Encounter for general adult medical examination without abnormal findings: Secondary | ICD-10-CM

## 2018-12-13 DIAGNOSIS — E7800 Pure hypercholesterolemia, unspecified: Secondary | ICD-10-CM

## 2018-12-13 NOTE — Assessment & Plan Note (Signed)
The 10-year ASCVD risk score [name] DC [name]., et al., 2013) is: 1%   Values used to calculate the score:     Age: 43 years     Sex: Male     Is Non-Hispanic African American: No     Diabetic: No     Tobacco smoker: No     Systolic Blood Pressure: 459 mmHg     Is BP treated: No     HDL Cholesterol: 65 mg/dL     Total Cholesterol: 208 mg/dL LDL-120 Advised to reduce saturated fat intake Remain as active as possible

## 2018-12-13 NOTE — Assessment & Plan Note (Signed)
Referral to Urology placed, re: Elevated PSA and family history of Prostate cancer. Other labs were stable with just minor elevations in total and LDL (bad) cholesterol. Recommend reducing saturated fat by 25% to help normalize. Continue your active lifestyle and try to reduce to stop smokeless use- you can do it! Continue to social distance and wear a mask when in public. Recommend annual physical with fasting labs the week prior.

## 2018-12-13 NOTE — Patient Instructions (Addendum)

## 2018-12-13 NOTE — Assessment & Plan Note (Signed)
PSA- 6.8 Reports occasional urinary dribbling Referral to Urology placed.

## 2019-01-18 ENCOUNTER — Ambulatory Visit: Payer: Self-pay | Admitting: Urology

## 2019-01-18 ENCOUNTER — Other Ambulatory Visit: Payer: Self-pay

## 2019-01-18 ENCOUNTER — Ambulatory Visit: Payer: No Typology Code available for payment source | Admitting: Urology

## 2019-01-18 ENCOUNTER — Encounter: Payer: Self-pay | Admitting: Urology

## 2019-01-18 VITALS — BP 117/47 | HR 105 | Ht 65.0 in | Wt 145.0 lb

## 2019-01-18 DIAGNOSIS — R972 Elevated prostate specific antigen [PSA]: Secondary | ICD-10-CM | POA: Diagnosis not present

## 2019-01-18 DIAGNOSIS — Z8042 Family history of malignant neoplasm of prostate: Secondary | ICD-10-CM

## 2019-01-18 NOTE — Progress Notes (Signed)
01/18/2019 1:28 PM   Roby Lofts 1975-10-29 [id number]  Referring provider: [name], NP [address] [place],  [place] [address]  Chief Complaint  Patient presents with  . Elevated PSA    HPI: Richard Spencer is a 43 y.o. male seen in consultation at the request of Faith Rogue, NP for evaluation of an elevated PSA.  He has a family history of prostate cancer in his father who was diagnosed in his late 50s/early 21s.  A PSA drawn on 12/10/2018 was elevated at 6.8.  This was his first PSA he had checked so no comparisons available.  He has mild lower urinary tract symptoms with occasional urinary hesitancy.  He notes some difficulty in voiding after ejaculation with mild burning sensation.  He notes occasional mild scrotal pain.  Denies dysuria, gross hematuria.  He has no flank, abdominal, pelvic or scrotal pain.   PMH: Past Medical History:  Diagnosis Date  . Allergic rhinitis   . GERD (gastroesophageal reflux disease)   . Hypercholesterolemia   . Rotator cuff tear, right     Surgical History: Past Surgical History:  Procedure Laterality Date  . LUMBAR LAMINECTOMY  06/2010  . SHOULDER ARTHROSCOPY WITH OPEN ROTATOR CUFF REPAIR Right 08/08/2013   Procedure: RIGHT SHOULDER ARTHROSCOPY WITH MINI OPEN ROTATOR CUFF REPAIR AND SUBACROMIAL DECOMPRESSION ;  Surgeon: [name], MD;  Location: [facility];  Service: Orthopedics;  Laterality: Right;  . SHOULDER ARTHROSCOPY WITH OPEN ROTATOR CUFF REPAIR AND DISTAL CLAVICLE ACROMINECTOMY Left 01/21/2016   Procedure: LEFT SHOULDER ARTHROSCOPY SUBMACROMINIAL DECOMPRESSION WITH MINI  OPEN ROTATOR CUFF REPAIR;  Surgeon: [name], MD;  Location: [facility];  Service: Orthopedics;  Laterality: Left;  [name] VASECTOMY      Home Medications:  Allergies as of 01/18/2019      Reactions   Erythromycin Other (See Comments)   GI upset      Medication List       Accurate as of January 18, 2019  1:28 PM. If you have any questions, ask  your nurse or doctor.        ibuprofen 200 MG tablet Commonly known as: ADVIL Take 2-4 tablets (400-800 mg total) by mouth every 6 (six) hours as needed for mild pain or moderate pain. Resume after toradol is complete, do not take with toradol   omeprazole 20 MG tablet Commonly known as: PRILOSEC OTC Take 20 mg by mouth daily.   OVER THE COUNTER MEDICATION CBD Oil       Allergies:  Allergies  Allergen Reactions  . Erythromycin Other (See Comments)    GI upset    Family History: Family History  Problem Relation Age of Onset  . Prostate cancer Father   . Coronary artery disease Father   . Heart attack Father   . Heart disease Father   . Diabetes Father   . Hyperlipidemia Father   . Hypertension Mother   . Hyperlipidemia Mother   . Rheum arthritis Maternal Grandmother   . Hyperlipidemia Brother   . Colon cancer Neg Hx     Social History:  reports that he quit smoking about 26 years ago. His smoking use included cigarettes. He has a 4.00 pack-year smoking history. His smokeless tobacco use includes snuff. He reports current alcohol use of about 12.0 standard drinks of alcohol per week. He reports that he does not use drugs.  ROS: UROLOGY Frequent Urination?: No Hard to postpone urination?: No Burning/pain with urination?: No Get up at night  to urinate?: Yes Leakage of urine?: No Urine stream starts and stops?: Yes Trouble starting stream?: Yes Do you have to strain to urinate?: Yes Blood in urine?: No Urinary tract infection?: No Sexually transmitted disease?: No Injury to kidneys or bladder?: No Painful intercourse?: No Weak stream?: No Erection problems?: No Penile pain?: No  Gastrointestinal Nausea?: No Vomiting?: No Indigestion/heartburn?: Yes Diarrhea?: No Constipation?: No  Constitutional Fever: No Night sweats?: No Weight loss?: No Fatigue?: No  Skin Skin rash/lesions?: No Itching?: No  Eyes Blurred vision?: No Double vision?: No   Ears/Nose/Throat Sore throat?: No Sinus problems?: Yes  Hematologic/Lymphatic Swollen glands?: No Easy bruising?: No  Cardiovascular Leg swelling?: No Chest pain?: No  Respiratory Cough?: No Shortness of breath?: No  Endocrine Excessive thirst?: No  Musculoskeletal Back pain?: Yes Joint pain?: Yes  Neurological Headaches?: No Dizziness?: No  Psychologic Depression?: No Anxiety?: No  Physical Exam: BP (!) 117/47   Pulse (!) 105   Ht 5\' 5"  (1.651 m)   Wt 145 lb (65.8 kg)   BMI 24.13 kg/m   Constitutional:  Alert and oriented, No acute distress. HEENT: [place] AT, moist mucus membranes.  Trachea midline, no masses. Cardiovascular: No clubbing, cyanosis, or edema. Respiratory: Normal respiratory effort, no increased work of breathing. GI: Abdomen is soft, nontender, nondistended, no abdominal masses GU: Penis without lesions, testes descended bilateral without masses or tenderness.  Mild left testis atrophy.  Spermatic cord/epididymis palpably normal bilaterally.  Prostate 35 g, smooth without nodules. Lymph: No cervical or inguinal lymphadenopathy. Skin: No rashes, bruises or suspicious lesions. Neurologic: Grossly intact, no focal deficits, moving all 4 extremities. Psychiatric: Normal mood and affect.   Assessment & Plan:    - Elevated PSA Mild PSA elevation with benign DRE. Although PSA is a prostate cancer screening test he was informed that cancer is not the most common cause of an elevated PSA. Other potential causes including BPH and inflammation were discussed. He was informed that the only way to adequately diagnose prostate cancer would be a transrectal ultrasound and biopsy of the prostate. The procedure was discussed including potential risks of bleeding and infection/sepsis. He was also informed that a negative biopsy does not conclusively rule out the possibility that prostate cancer may be present and that continued monitoring is required. The use of newer  adjunctive blood tests including PHI and 4kScore were discussed. The use of multiparametric prostate MRI was also discussed however is not typically used for initial evaluation of an elevated PSA. Continued periodic surveillance was also discussed.  Will repeat his PSA today and if still elevated would recommend scheduling an MRI of the prostate.  If not covered by his insurance would recommend a standard prostate biopsy based on his family history.   Abbie Sons, [place] [address] [place], [place] [address] [phone]

## 2019-01-19 LAB — PSA: Prostate Specific Ag, Serum: 7.7 ng/mL — ABNORMAL HIGH (ref 0.0–4.0)

## 2019-01-22 ENCOUNTER — Other Ambulatory Visit: Payer: Self-pay | Admitting: Urology

## 2019-01-22 ENCOUNTER — Telehealth: Payer: Self-pay | Admitting: *Deleted

## 2019-01-22 DIAGNOSIS — R972 Elevated prostate specific antigen [PSA]: Secondary | ICD-10-CM

## 2019-01-22 NOTE — Telephone Encounter (Signed)
PSA remains elevated 7.7. Recommend scheduling prostate MRI. Order entered.  Notified patient as instructed, patient pleased. Discussed follow-up appointments, patient agrees

## 2019-02-13 ENCOUNTER — Other Ambulatory Visit: Payer: Self-pay

## 2019-02-13 ENCOUNTER — Ambulatory Visit
Admission: RE | Admit: 2019-02-13 | Discharge: 2019-02-13 | Disposition: A | Discharge status: Home / Self Care | Payer: PRIVATE HEALTH INSURANCE | Source: Ambulatory Visit | Attending: Urology | Admitting: Urology

## 2019-02-13 DIAGNOSIS — R972 Elevated prostate specific antigen [PSA]: Secondary | ICD-10-CM | POA: Insufficient documentation

## 2019-02-13 MED ORDER — GADOBUTROL 1 MMOL/ML IV SOLN
6.0000 mL | Freq: Once | INTRAVENOUS | Status: AC | PRN
  Administered 2019-02-13: 17:00:00 6 mL via INTRAVENOUS

## 2019-02-22 ENCOUNTER — Telehealth: Payer: Self-pay | Admitting: Urology

## 2019-02-22 NOTE — Telephone Encounter (Signed)
I will give him a call or send him a my chart message.

## 2019-02-22 NOTE — Telephone Encounter (Signed)
Patient called and asked if could be called with  His results for his MRI or if he can change his app for a telephone app? He said he has already seen them on [employer]

## 2019-02-25 NOTE — Telephone Encounter (Signed)
Ok patient is aware   thanks

## 2019-02-26 MED ORDER — TAMSULOSIN HCL 0.4 MG PO CAPS: 0.4000 mg | ORAL_CAPSULE | Freq: Every day | ORAL | 0 refills | Status: DC

## 2019-02-26 NOTE — Telephone Encounter (Signed)
LMOM to c/b to schedule lab appt per Dr Bernardo Heater.

## 2019-02-26 NOTE — Telephone Encounter (Signed)
Patient was contacted regarding his prostate MRI results.  There were no lesions suspicious for prostate cancer.  There were findings that may be indicative of prostatitis.  30-day trial tamsulosin was sent to pharmacy and will repeat his PSA in approximately 1 month.

## 2019-02-27 ENCOUNTER — Ambulatory Visit: Payer: No Typology Code available for payment source | Admitting: Urology

## 2019-03-11 ENCOUNTER — Ambulatory Visit: Payer: No Typology Code available for payment source | Admitting: Urology

## 2019-04-01 ENCOUNTER — Other Ambulatory Visit: Payer: No Typology Code available for payment source

## 2019-04-16 ENCOUNTER — Other Ambulatory Visit: Payer: Self-pay

## 2019-04-16 ENCOUNTER — Other Ambulatory Visit: Payer: BC Managed Care – PPO

## 2019-04-16 DIAGNOSIS — R972 Elevated prostate specific antigen [PSA]: Secondary | ICD-10-CM | POA: Diagnosis not present

## 2019-04-17 LAB — PSA: Prostate Specific Ag, Serum: 8.8 ng/mL — ABNORMAL HIGH (ref 0.0–4.0)

## 2019-04-22 ENCOUNTER — Encounter: Payer: Self-pay | Admitting: Urology

## 2019-05-09 ENCOUNTER — Telehealth: Payer: Self-pay | Admitting: Urology

## 2019-05-09 NOTE — Telephone Encounter (Signed)
Pt would like a call back to discuss a Biopsy. He would like more information about the procedure before he schedules. [phone] (work) if he doesn't answer cell ph.

## 2019-05-09 NOTE — Telephone Encounter (Signed)
Called pt explained in detail the prostate biopsy procedure. All questions answered. Pt request a valium to be taken prior to procedure.

## 2019-05-10 MED ORDER — DIAZEPAM 10 MG PO TABS: ORAL_TABLET | ORAL | 0 refills | Status: DC

## 2019-05-10 NOTE — Addendum Note (Signed)
Addended by: Abbie Sons on: 05/10/2019 03:42 PM   Modules accepted: Orders

## 2019-05-10 NOTE — Telephone Encounter (Signed)
Rx Valium was sent.  He will need a driver

## 2019-05-13 NOTE — Telephone Encounter (Signed)
mychart notification sent 

## 2019-05-21 ENCOUNTER — Other Ambulatory Visit: Payer: Self-pay

## 2019-05-21 ENCOUNTER — Encounter: Payer: Self-pay | Admitting: Urology

## 2019-05-21 ENCOUNTER — Other Ambulatory Visit: Payer: Self-pay | Admitting: Urology

## 2019-05-21 ENCOUNTER — Ambulatory Visit: Payer: BC Managed Care – PPO | Admitting: Urology

## 2019-05-21 VITALS — BP 136/86 | HR 79 | Ht 65.0 in | Wt 146.8 lb

## 2019-05-21 DIAGNOSIS — R972 Elevated prostate specific antigen [PSA]: Secondary | ICD-10-CM | POA: Diagnosis not present

## 2019-05-21 DIAGNOSIS — N4289 Other specified disorders of prostate: Secondary | ICD-10-CM | POA: Diagnosis not present

## 2019-05-21 DIAGNOSIS — C61 Malignant neoplasm of prostate: Secondary | ICD-10-CM | POA: Diagnosis not present

## 2019-05-21 MED ORDER — GENTAMICIN SULFATE 40 MG/ML IJ SOLN
80.0000 mg | Freq: Once | INTRAMUSCULAR | Status: AC
  Administered 2019-05-21: 80 mg via INTRAMUSCULAR

## 2019-05-21 MED ORDER — LEVOFLOXACIN 500 MG PO TABS
500.0000 mg | ORAL_TABLET | Freq: Once | ORAL | Status: AC
  Administered 2019-05-21: 500 mg via ORAL

## 2019-05-21 NOTE — Progress Notes (Signed)
Prostate Biopsy Procedure   Informed consent was obtained after discussing risks/benefits of the procedure.  A time out was performed to ensure correct patient identity.  Pre-Procedure: - Last PSA Level: 8.31 March 2019 - Gentamicin given prophylactically - Levaquin 500 mg administered PO -Transrectal Ultrasound performed revealing a 19.7 gm prostate -No significant hypoechoic or median lobe noted  Procedure: - Prostate block performed using 10 cc 1% lidocaine and biopsies taken from sextant areas, a total of 12 under ultrasound guidance.  Post-Procedure: - Patient tolerated the procedure well - He was counseled to seek immediate medical attention if experiences any severe pain, significant bleeding, or fevers -Will contact patient with biopsy results  John Giovanni, MD

## 2019-05-21 NOTE — Patient Instructions (Signed)
Transrectal Ultrasound-Guided Prostate Biopsy, Care After °This sheet gives you information about how to care for yourself after your procedure. Your doctor may also give you more specific instructions. If you have problems or questions, contact your doctor. °What can I expect after the procedure? °After the procedure, it is common to have: °· Pain and discomfort in your butt, especially while sitting. °· Pink-colored pee (urine), due to small amounts of blood in the pee. °· Burning while peeing (urinating). °· Blood in your poop (stool). °· Bleeding from your butt. °· Blood in your semen. °Follow these instructions at home: °Medicines °· Take over-the-counter and prescription medicines only as told by your doctor. °· If you were prescribed antibiotic medicine, take it as told by your doctor. Do not stop taking the antibiotic even if you start to feel better. °Activity ° °· Do not drive for 24 hours if you were given a medicine to help you relax (sedative) during your procedure. °· Return to your normal activities as told by your doctor. Ask your doctor what activities are safe for you. °· Ask your doctor when it is okay for you to have sex. °· Do not lift anything that is heavier than 10 lb (4.5 kg), or the limit that you are told, until your doctor says that it is safe. °General instructions ° °· Drink enough water to keep your pee pale yellow. °· Watch your pee, poop, and semen for new bleeding or bleeding that gets worse. °· Keep all follow-up visits as told by your doctor. This is important. °Contact a doctor if you: °· Have blood clots in your pee or poop. °· Notice that your pee smells bad or unusual. °· Have very bad belly pain. °· Have trouble peeing. °· Notice that your lower belly feels firm. °· Have blood in your pee for more than 2 weeks after the procedure. °· Have blood in your semen for more than 2 months after the procedure. °· Have problems getting an erection. °· Feel sick to your stomach  (nauseous). °· Throw up (vomit). °· Have new or worse bleeding in your pee, poop, or semen. °Get help right away if you: °· Have a fever or chills. °· Have bright red pee. °· Have very bad pain that does not get better with medicine. °· Cannot pee. °Summary °· After this procedure, it is common to have pain and discomfort around your butt, especially while sitting. °· You may have blood in your pee and poop. °· It is common to have blood in your semen for 1-2 months. °· If you were prescribed antibiotic medicine, take it as told by your doctor. Do not stop taking the antibiotic even if you start to feel better. °· Get help right away if you have a fever or chills. °This information is not intended to replace advice given to you by your health care provider. Make sure you discuss any questions you have with your health care provider. °Document Released: 03/07/2017 Document Revised: 08/29/2018 Document Reviewed: 03/07/2017 °Elsevier Patient Education © 2020 [employer] ° °

## 2019-05-29 LAB — ANATOMIC PATHOLOGY REPORT: PDF Image: 0

## 2019-05-30 ENCOUNTER — Other Ambulatory Visit: Payer: Self-pay

## 2019-05-30 ENCOUNTER — Telehealth (INDEPENDENT_AMBULATORY_CARE_PROVIDER_SITE_OTHER): Payer: BC Managed Care – PPO | Admitting: Urology

## 2019-05-30 DIAGNOSIS — C61 Malignant neoplasm of prostate: Secondary | ICD-10-CM

## 2019-05-31 NOTE — Progress Notes (Signed)
Virtual Visit via Telephone Note  I connected with Richard Spencer on 05/31/19 at  3:45 PM EST by telephone and verified that I am speaking with the correct person using two identifiers.  Location: Patient: Work Provider: Office   I discussed the limitations, risks, security and privacy concerns of performing an evaluation and management service by telephone and the availability of in person appointments. I also discussed with the patient that there may be a patient responsible charge related to this service. The patient expressed understanding and agreed to proceed.   History of Present Illness: 44 y.o. male with a history of a rising PSA who underwent prostate biopsy on 05/21/2019 for a PSA of 8.8.  Prostate MRI September 2020 showed no lesions suspicious for high-grade prostate cancer.  Based on his age and rising PSA a standard biopsy was recommended.  He had no post biopsy complaints.  Pathology: Cores from the left apex and  RLM were positive for Gleason 3+3 adenocarcinoma (10% / 20%).  The left mid core showed a small focus of atypical cells which were suspicious but not diagnostic for adenocarcinoma.   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    NCCN risk stratification: Very low  We discussed management options.  His life expectancy is >20 years and active surveillance is the preferred option.  Radical prostatectomy and radiation modalities were also discussed.  I also discussed the availability of HIFU which does have the advantages of minimal morbidity.  Follow Up Instructions: He is leaning towards active surveillance but will think over these options.  I will touch base with him in approximately 1 week.   I discussed the assessment and treatment plan with the patient. The patient was provided an opportunity to ask questions and all were answered. The patient agreed with the plan and demonstrated an  understanding of the instructions.   The patient was advised to call back or seek an in-person evaluation if the symptoms worsen or if the condition fails to improve as anticipated.  I provided 10 minutes of non-face-to-face time during this encounter.   Abbie Sons, MD

## 2019-06-05 ENCOUNTER — Telehealth: Payer: BC Managed Care – PPO | Admitting: Urology

## 2020-05-01 ENCOUNTER — Encounter: Payer: Self-pay | Admitting: Urology

## 2020-05-01 ENCOUNTER — Other Ambulatory Visit: Payer: Self-pay

## 2020-05-01 ENCOUNTER — Ambulatory Visit (INDEPENDENT_AMBULATORY_CARE_PROVIDER_SITE_OTHER): Payer: BC Managed Care – PPO | Admitting: Urology

## 2020-05-01 VITALS — BP 137/89 | HR 90 | Ht 65.0 in | Wt 145.0 lb

## 2020-05-01 DIAGNOSIS — C61 Malignant neoplasm of prostate: Secondary | ICD-10-CM

## 2020-05-01 DIAGNOSIS — N5082 Scrotal pain: Secondary | ICD-10-CM | POA: Diagnosis not present

## 2020-05-01 DIAGNOSIS — R972 Elevated prostate specific antigen [PSA]: Secondary | ICD-10-CM

## 2020-05-01 DIAGNOSIS — N50812 Left testicular pain: Secondary | ICD-10-CM

## 2020-05-01 DIAGNOSIS — K4090 Unilateral inguinal hernia, without obstruction or gangrene, not specified as recurrent: Secondary | ICD-10-CM | POA: Diagnosis not present

## 2020-05-01 DIAGNOSIS — R399 Unspecified symptoms and signs involving the genitourinary system: Secondary | ICD-10-CM | POA: Diagnosis not present

## 2020-05-01 MED ORDER — TAMSULOSIN HCL 0.4 MG PO CAPS: 0.4000 mg | ORAL_CAPSULE | Freq: Every day | ORAL | 3 refills | Status: DC

## 2020-05-01 NOTE — Progress Notes (Signed)
05/01/2020 12:20 PM   Richard Spencer [birth year] [id number]  Referring provider: No referring provider defined for this encounter.  Chief Complaint  Patient presents with  . Testicle Pain    HPI: 44 y.o. male presents for evaluation of scrotal pain.   Diagnosed with very low risk prostate cancer December 2020  Was contemplating options and elected surveillance though has not been seen since December 2020  Had an episode of left scrotal pain over Thanksgiving which resolved after 1 day but has also noted a bulge in the right groin region  Denies dysuria, gross hematuria  Denies flank, abdominal or pelvic pain  Was given a trial of Flomax when PSA was elevated and felt he urinated better and is interested in starting this medication   PMH: Past Medical History:  Diagnosis Date  . Allergic rhinitis   . GERD (gastroesophageal reflux disease)   . Hypercholesterolemia   . Rotator cuff tear, right     Surgical History: Past Surgical History:  Procedure Laterality Date  . LUMBAR LAMINECTOMY  06/2010  . SHOULDER ARTHROSCOPY WITH OPEN ROTATOR CUFF REPAIR Right 08/08/2013   Procedure: RIGHT SHOULDER ARTHROSCOPY WITH MINI OPEN ROTATOR CUFF REPAIR AND SUBACROMIAL DECOMPRESSION ;  Surgeon: [name], MD;  Location: [facility];  Service: Orthopedics;  Laterality: Right;  . SHOULDER ARTHROSCOPY WITH OPEN ROTATOR CUFF REPAIR AND DISTAL CLAVICLE ACROMINECTOMY Left 01/21/2016   Procedure: LEFT SHOULDER ARTHROSCOPY SUBMACROMINIAL DECOMPRESSION WITH MINI  OPEN ROTATOR CUFF REPAIR;  Surgeon: [name], MD;  Location: [facility];  Service: Orthopedics;  Laterality: Left;  [name] VASECTOMY      Home Medications:  Allergies as of 05/01/2020      Reactions   Erythromycin Other (See Comments)   GI upset      Medication List       Accurate as of May 01, 2020 12:20 PM. If you have any questions, ask your nurse or doctor.        STOP taking these medications   diazepam 10 MG  tablet Commonly known as: VALIUM Stopped by: Abbie Sons, MD   tamsulosin 0.4 MG Caps capsule Commonly known as: FLOMAX Stopped by: Abbie Sons, MD     TAKE these medications   ibuprofen 200 MG tablet Commonly known as: ADVIL Take 2-4 tablets (400-800 mg total) by mouth every 6 (six) hours as needed for mild pain or moderate pain. Resume after toradol is complete, do not take with toradol   omeprazole 20 MG tablet Commonly known as: PRILOSEC OTC Take 20 mg by mouth daily.   OVER THE COUNTER MEDICATION CBD Oil       Allergies:  Allergies  Allergen Reactions  . Erythromycin Other (See Comments)    GI upset    Family History: Family History  Problem Relation Age of Onset  . Prostate cancer Father   . Coronary artery disease Father   . Heart attack Father   . Heart disease Father   . Diabetes Father   . Hyperlipidemia Father   . Hypertension Mother   . Hyperlipidemia Mother   . Rheum arthritis Maternal Grandmother   . Hyperlipidemia Brother   . Colon cancer Neg Hx     Social History:  reports that he quit smoking about 27 years ago. His smoking use included cigarettes. He has a 4.00 pack-year smoking history. His smokeless tobacco use includes snuff. He reports current alcohol use of about 12.0 standard drinks of alcohol per week. He reports that he  does not use drugs.   Physical Exam: BP 137/89   Pulse 90   Ht 5\' 5"  (1.651 m)   Wt 145 lb (65.8 kg)   BMI 24.13 kg/m   Constitutional:  Alert and oriented, No acute distress. HEENT: [place] AT, moist mucus membranes.  Trachea midline, no masses. Cardiovascular: No clubbing, cyanosis, or edema. Respiratory: Normal respiratory effort, no increased work of breathing. GI: Abdomen is soft, nontender.  Right inguinal hernia palpable on Valsalva GU: Prostate 25 g, smooth without nodules.  Testes descended bilaterally no masses or tenderness; spermatic cord/epididymis palpably normal bilaterally Skin: No rashes,  bruises or suspicious lesions. Neurologic: Grossly intact, no focal deficits, moving all 4 extremities. Psychiatric: Normal mood and affect.   Assessment & Plan:    1.  Left scrotal pain  Resolved  2.  Right inguinal hernia  Asymptomatic  He may continue regular activities  General surgery referral should he develop pain or bothersome symptoms  3.  T1c very low risk prostate cancer  Has elected active surveillance  PSA today  We discussed recommendation of a confirmatory biopsy and he desires to schedule  4.  Lower urinary tract symptoms  Rx tamsulosin sent to [facility], [place] [address], [place], [place] [address] [phone]

## 2020-05-02 LAB — PSA: Prostate Specific Ag, Serum: 9 ng/mL — ABNORMAL HIGH (ref 0.0–4.0)

## 2020-05-04 ENCOUNTER — Encounter: Payer: Self-pay | Admitting: *Deleted

## 2020-05-04 LAB — URINALYSIS, COMPLETE
Bilirubin, UA: NEGATIVE
Glucose, UA: NEGATIVE
Ketones, UA: NEGATIVE
Leukocytes,UA: NEGATIVE
Nitrite, UA: NEGATIVE
Protein,UA: NEGATIVE
RBC, UA: NEGATIVE
Specific Gravity, UA: 1.015 (ref 1.005–1.030)
Urobilinogen, Ur: 0.2 mg/dL (ref 0.2–1.0)
pH, UA: 6.5 (ref 5.0–7.5)

## 2020-05-04 LAB — MICROSCOPIC EXAMINATION
Bacteria, UA: NONE SEEN
Epithelial Cells (non renal): NONE SEEN /hpf (ref 0–10)
RBC, Urine: NONE SEEN /hpf (ref 0–2)
WBC, UA: NONE SEEN /hpf (ref 0–5)

## 2020-05-27 ENCOUNTER — Telehealth: Payer: BC Managed Care – PPO | Admitting: Nurse Practitioner

## 2020-05-27 DIAGNOSIS — J029 Acute pharyngitis, unspecified: Secondary | ICD-10-CM | POA: Diagnosis not present

## 2020-05-27 DIAGNOSIS — R059 Cough, unspecified: Secondary | ICD-10-CM | POA: Diagnosis not present

## 2020-05-27 DIAGNOSIS — Z20822 Contact with and (suspected) exposure to covid-19: Secondary | ICD-10-CM | POA: Diagnosis not present

## 2020-05-27 DIAGNOSIS — R52 Pain, unspecified: Secondary | ICD-10-CM | POA: Diagnosis not present

## 2020-05-27 MED ORDER — BENZONATATE 100 MG PO CAPS: 100.0000 mg | ORAL_CAPSULE | Freq: Three times a day (TID) | ORAL | 0 refills | Status: AC | PRN

## 2020-05-27 MED ORDER — NAPROXEN 500 MG PO TABS: 500.0000 mg | ORAL_TABLET | Freq: Two times a day (BID) | ORAL | 0 refills | Status: AC

## 2020-05-27 NOTE — Progress Notes (Signed)
E-Visit for Corona Virus Screening  Your current symptoms could be consistent with the coronavirus.  Many health care providers can now test patients at their office but not all are.  [facility] has multiple testing sites. For information on our COVID testing locations and hours go to [email or web address]   Testing Information: The COVID-19 Community Testing sites are testing BY APPOINTMENT ONLY.  You can schedule online at [email or web address]  If you do not have access to a smart phone or computer you may call [phone] for an appointment.   Additional testing sites in the Community:  . For [facility] Testing sites in [place]  https://www.cvs.com/minuteclinic/covid-19-testing  . For Pop-up testing sites in [place]  https://covid19.ncdhhs.gov/about-covid-19/testing/find-my-testing-place/pop-testing-sites  . For [facility] https://www.[place].gov/[place]ounty/human-services/health-department/coronavirus-covid-19-info/covid-19-testing  . For [place] testing in [place] https://www.[place].gov/[place]-c[place]/human-services/health-department/coronavirus-covid-19-info/covid-19-testing  . For [facility] testing in [place]   https://[facility].care/covidtesting  For  more information about community testing call [phone]   Please quarantine yourself while awaiting your test results. Please stay home for a minimum of 10 days from the first day of illness with improving symptoms and you have had 24 hours of no fever (without the use of Tylenol (Acetaminophen) Motrin (Ibuprofen) or any fever reducing medication).  Also - Do not get tested prior to returning to work because once you have had a positive test the test can stay positive for more than a month in some cases.   You should wear a mask or cloth face covering over your nose and mouth if you must be around other people or animals, including pets (even at home). Try to  stay at least 6 feet away from other people. This will protect the people around you.  Please continue good preventive care measures, including:  frequent hand-washing, avoid touching your face, cover coughs/sneezes, stay out of crowds and keep a 6 foot distance from others.  COVID-19 is a respiratory illness with symptoms that are similar to the flu. Symptoms are typically mild to moderate, but there have been cases of severe illness and death due to the virus.   The following symptoms may appear 2-14 days after exposure: . Fever . Cough . Shortness of breath or difficulty breathing . Chills . Repeated shaking with chills . Muscle pain . Headache . Sore throat . New loss of taste or smell . Fatigue . Congestion or runny nose . Nausea or vomiting . Diarrhea  Go to the nearest hospital ED for assessment if fever/cough/breathlessness are severe or illness seems like a threat to life.  It is vitally important that if you feel that you have an infection such as this virus or any other virus that you stay home and away from places where you may spread it to others.  You should avoid contact with people age 65 and older.   You can use medication such as A prescription cough medication called Tessalon Perles 100 mg. You may take 1-2 capsules every 8 hours as needed for cough and A prescription anti-inflammatory called Naprosyn 500 mg. Take twice daily as needed for fever or body aches for 2 weeks  You may also take acetaminophen (Tylenol) as needed for fever.  Reduce your risk of any infection by using the same precautions used for avoiding the common cold or flu:  . Wash your hands often with soap and warm water for at least 20 seconds.  If soap and water are not readily available, use an alcohol-based hand sanitizer with at least 60% alcohol.  .   If coughing or sneezing, cover your mouth and nose by coughing or sneezing into the elbow areas of your shirt or coat, into a tissue or into your sleeve  (not your hands). . Avoid shaking hands with others and consider head nods or verbal greetings only. . Avoid touching your eyes, nose, or mouth with unwashed hands.  . Avoid close contact with people who are sick. . Avoid places or events with large numbers of people in one location, like concerts or sporting events. . Carefully consider travel plans you have or are making. . If you are planning any travel outside or inside the [place], visit the CDC's Travelers' Health webpage for the latest health notices. . If you have some symptoms but not all symptoms, continue to monitor at home and seek medical attention if your symptoms worsen. . If you are having a medical emergency, call 911.  HOME CARE . Only take medications as instructed by your medical team. . Drink plenty of fluids and get plenty of rest. . A steam or ultrasonic humidifier can help if you have congestion.   GET HELP RIGHT AWAY IF YOU HAVE EMERGENCY WARNING SIGNS** FOR COVID-19. If you or someone is showing any of these signs seek emergency medical care immediately. Call 911 or proceed to your closest emergency facility if: . You develop worsening high fever. . Trouble breathing . Bluish lips or face . Persistent pain or pressure in the chest . New confusion . Inability to wake or stay awake . You cough up blood. . Your symptoms become more severe  **This list is not all possible symptoms. Contact your medical provider for any symptoms that are sever or concerning to you.  MAKE SURE YOU   Understand these instructions.  Will watch your condition.  Will get help right away if you are not doing well or get worse.  Your e-visit answers were reviewed by a board certified advanced clinical practitioner to complete your personal care plan.  Depending on the condition, your plan could have included both over the counter or prescription medications.  If there is a problem please reply once you have received a response from your  provider.  Your safety is important to us.  If you have drug allergies check your prescription carefully.    You can use MyChart to ask questions about today's visit, request a non-urgent call back, or ask for a work or school excuse for 24 hours related to this e-Visit. If it has been greater than 24 hours you will need to follow up with your provider, or enter a new e-Visit to address those concerns. You will get an e-mail in the next two days asking about your experience.  I hope that your e-visit has been valuable and will speed your recovery. Thank you for using e-visits.   5-10 minutes spent reviewing and documenting in chart.  

## 2020-06-30 DIAGNOSIS — E7211 Homocystinuria: Secondary | ICD-10-CM | POA: Diagnosis not present

## 2020-06-30 DIAGNOSIS — E559 Vitamin D deficiency, unspecified: Secondary | ICD-10-CM | POA: Diagnosis not present

## 2020-06-30 DIAGNOSIS — E8881 Metabolic syndrome: Secondary | ICD-10-CM | POA: Diagnosis not present

## 2020-06-30 DIAGNOSIS — E349 Endocrine disorder, unspecified: Secondary | ICD-10-CM | POA: Diagnosis not present

## 2020-06-30 DIAGNOSIS — D519 Vitamin B12 deficiency anemia, unspecified: Secondary | ICD-10-CM | POA: Diagnosis not present

## 2020-06-30 DIAGNOSIS — E538 Deficiency of other specified B group vitamins: Secondary | ICD-10-CM | POA: Diagnosis not present

## 2020-07-28 ENCOUNTER — Telehealth: Payer: Self-pay

## 2020-07-28 MED ORDER — DIAZEPAM 10 MG PO TABS: ORAL_TABLET | ORAL | 0 refills | Status: AC

## 2020-07-28 NOTE — Telephone Encounter (Signed)
Incoming call from pt, who is requesting a one time dose of valium prior to his biopsy on Friday. Pharmacy in chart is verified. Please advise.

## 2020-07-29 ENCOUNTER — Encounter: Payer: Self-pay | Admitting: *Deleted

## 2020-07-31 ENCOUNTER — Other Ambulatory Visit: Payer: Self-pay

## 2020-07-31 ENCOUNTER — Ambulatory Visit: Payer: BC Managed Care – PPO | Admitting: Urology

## 2020-07-31 ENCOUNTER — Encounter: Payer: Self-pay | Admitting: Urology

## 2020-07-31 VITALS — BP 112/72 | HR 83 | Ht 65.0 in | Wt 145.0 lb

## 2020-07-31 DIAGNOSIS — C61 Malignant neoplasm of prostate: Secondary | ICD-10-CM | POA: Diagnosis not present

## 2020-07-31 MED ORDER — ALFUZOSIN HCL ER 10 MG PO TB24: 10.0000 mg | ORAL_TABLET | Freq: Every day | ORAL | 3 refills | Status: AC

## 2020-07-31 MED ORDER — CEFTRIAXONE SODIUM 500 MG IJ SOLR
1000.0000 mg | Freq: Once | INTRAMUSCULAR | Status: AC
  Administered 2020-07-31: 1000 mg via INTRAMUSCULAR

## 2020-07-31 NOTE — Progress Notes (Signed)
Prostate Biopsy Procedure   Informed consent was obtained after discussing risks/benefits of the procedure.  A time out was performed to ensure correct patient identity.  Pre-Procedure: - Indication: T1c low risk prostate cancer for confirmatory biopsy - Gentamicin given prophylactically - Levaquin 500 mg administered PO -Transrectal Ultrasound performed revealing a 18 gm prostate -No significant hypoechoic or median lobe noted  Procedure: - Prostate block performed using 10 cc 1% lidocaine and biopsies taken from sextant areas, a total of 12 under ultrasound guidance.  Post-Procedure: - Patient tolerated the procedure well - He was counseled to seek immediate medical attention if experiences any severe pain, significant bleeding, or fevers -He is taking tamsulosin for lower urinary tract symptoms though feels this is causing side effects of moodiness -Rx alfuzosin sent to pharmacy -Will call with biopsy results   John Giovanni, MD

## 2020-08-03 LAB — SURGICAL PATHOLOGY

## 2020-08-07 ENCOUNTER — Encounter: Payer: Self-pay | Admitting: Urology

## 2020-08-08 NOTE — Progress Notes (Shared)
08/10/2020  2:24 PM   Roby Lofts 10/29/1975 [id number]  Referring provider: No referring provider defined for this encounter. No chief complaint on file.   HPI: Richard Spencer is a 45 y.o. male with left scrotal pain, right inguinal hernia, LUTS, and Tc1 very low risk prostate cancer returns for management and discussion regarding prostate biopsy results.   - Recent PSA is 9.0 as of 05/01/2020. - Patient underwent a prostate bx on 07/31/2020. 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 (1/12 cores) RLM noted small focus of atypical glands. (9/12 cores) LB, LM, LA, RM, RA, LLB, LLM, RLB, and RLA negative for malignancy.   PSA trend: Component     Latest Ref Rng & Units 12/10/2018 01/18/2019 04/16/2019 05/01/2020  Prostate Specific Ag, Serum     0.0 - 4.0 ng/mL 6.8 (H) 7.7 (H) 8.8 (H) 9.0 (H)    1.  Left scrotal pain  2.  Right inguinal hernia  3.  T1c very low risk prostate cancer  4.  Lower urinary tract symptoms   PMH: Past Medical History:  Diagnosis Date  . Allergic rhinitis   . GERD (gastroesophageal reflux disease)   . Hypercholesterolemia   . Rotator cuff tear, right     Surgical History: Past Surgical History:  Procedure Laterality Date  . LUMBAR LAMINECTOMY  06/2010  . SHOULDER ARTHROSCOPY WITH OPEN ROTATOR CUFF REPAIR Right 08/08/2013   Procedure: RIGHT SHOULDER ARTHROSCOPY WITH MINI OPEN ROTATOR CUFF REPAIR AND SUBACROMIAL DECOMPRESSION ;  Surgeon: [name], MD;  Location: [facility];  Service: Orthopedics;  Laterality: Right;  . SHOULDER ARTHROSCOPY WITH OPEN ROTATOR CUFF REPAIR AND DISTAL CLAVICLE ACROMINECTOMY Left 01/21/2016   Procedure: LEFT SHOULDER ARTHROSCOPY SUBMACROMINIAL DECOMPRESSION WITH MINI  OPEN ROTATOR CUFF REPAIR;  Surgeon: [name], MD;  Location: [facility];  Service: Orthopedics;  Laterality: Left;  [name] VASECTOMY      Home Medications:  Allergies as of 08/10/2020      Reactions    Erythromycin Other (See Comments)   GI upset      Medication List       Accurate as of August 08, 2020  2:24 PM. If you have any questions, ask your nurse or doctor.        alfuzosin 10 MG 24 hr tablet Commonly known as: UROXATRAL Take 1 tablet (10 mg total) by mouth daily with breakfast.   benzonatate 100 MG capsule Commonly known as: Tessalon Perles Take 1 capsule (100 mg total) by mouth 3 (three) times daily as needed.   diazepam 10 MG tablet Commonly known as: VALIUM 1 tab po 30 min prior to procedure   ibuprofen 200 MG tablet Commonly known as: ADVIL Take 2-4 tablets (400-800 mg total) by mouth every 6 (six) hours as needed for mild pain or moderate pain. Resume after toradol is complete, do not take with toradol   naproxen 500 MG tablet Commonly known as: Naprosyn Take 1 tablet (500 mg total) by mouth 2 (two) times daily with a meal.   omeprazole 20 MG tablet Commonly known as: PRILOSEC OTC Take 20 mg by mouth daily.   OVER THE COUNTER MEDICATION CBD Oil       Allergies:  Allergies  Allergen Reactions  . Erythromycin Other (See Comments)    GI upset    Family History: Family History  Problem Relation Age of Onset  . Prostate cancer Father   . Coronary artery disease Father   .  Heart attack Father   . Heart disease Father   . Diabetes Father   . Hyperlipidemia Father   . Hypertension Mother   . Hyperlipidemia Mother   . Rheum arthritis Maternal Grandmother   . Hyperlipidemia Brother   . Colon cancer Neg Hx     Social History:  reports that he quit smoking about 28 years ago. His smoking use included cigarettes. He has a 4.00 pack-year smoking history. His smokeless tobacco use includes snuff. He reports current alcohol use of about 12.0 standard drinks of alcohol per week. He reports that he does not use drugs.   Physical Exam: There were no vitals taken for this visit.  Constitutional:  Alert and oriented, No acute distress. HEENT: [place] AT,  moist mucus membranes.  Trachea midline, no masses. Cardiovascular: No clubbing, cyanosis, or edema. Respiratory: Normal respiratory effort, no increased work of breathing. GI: Abdomen is soft, nontender, nondistended, no abdominal masses GU: No CVA tenderness Lymph: No cervical or inguinal lymphadenopathy. Skin: No rashes, bruises or suspicious lesions. Neurologic: Grossly intact, no focal deficits, moving all 4 extremities. Psychiatric: Normal mood and affect.  Laboratory Data:  Lab Results  Component Value Date   CREATININE 1.20 12/10/2018    No results found for: PSA  No results found for: TESTOSTERONE  Lab Results  Component Value Date   HGBA1C 4.9 12/10/2018    Urinalysis   Pertinent Imaging: *** No results found for this or any previous visit.  No results found for this or any previous visit.  No results found for this or any previous visit.  No results found for this or any previous visit.  No results found for this or any previous visit.  No results found for this or any previous visit.  No results found for this or any previous visit.  No results found for this or any previous visit.   Assessment & Plan:     No follow-ups on file.  Fransico Him, am acting as a scribe for Dr. Nicki Reaper C. [employer],  {Add Media planner   [facility] [address] [place], [place] [address] [phone]

## 2020-08-10 ENCOUNTER — Ambulatory Visit: Payer: Self-pay | Admitting: Urology

## 2020-12-08 ENCOUNTER — Other Ambulatory Visit: Payer: Self-pay | Admitting: *Deleted

## 2020-12-08 DIAGNOSIS — C61 Malignant neoplasm of prostate: Secondary | ICD-10-CM

## 2021-02-03 IMAGING — MR MR PROSTATE WO/W CM
56 series · 56 of 56 positions shown · IV contrast (6ml Gadavist)
Comparison: None.

CLINICAL DATA: Elevated PSA level

EXAM:
MR PROSTATE WITHOUT AND WITH CONTRAST
TECHNIQUE: Multiplanar multisequence MRI images were obtained of the pelvis
centered about the prostate. Pre and post contrast images were
obtained.
CONTRAST:  6mL GADAVIST GADOBUTROL 1 MMOL/ML IV SOLN

[Series 3: T1 · axial · 8.0mm · 0.74mm/px · 1 of 25 slices shown (1 of 46)]
[im 1/25]
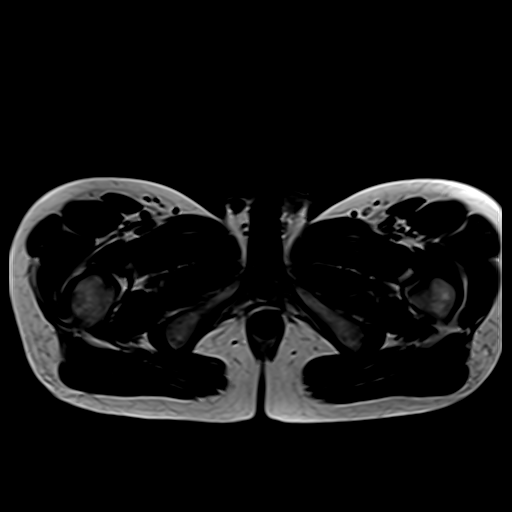

[Series 4: bSSFP fat-sat · axial · 8.0mm · 0.74mm/px · 1 of 25 slices shown]
[im 1/25]
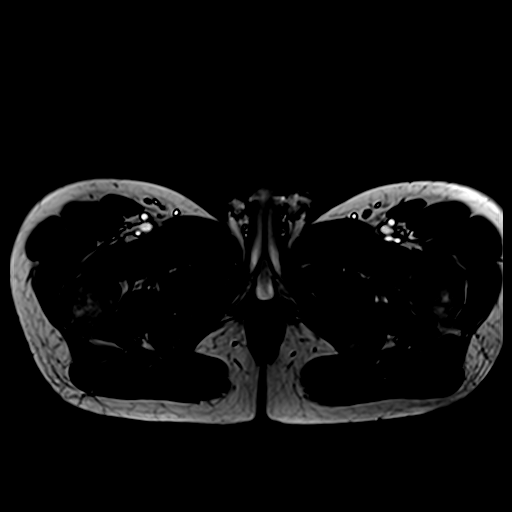

[Series 5: T2 · coronal · 3.0mm · 0.70mm/px · 1 of 30 slices shown (1 of 3)]
[im 1/30]
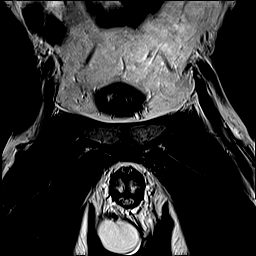

[Series 6: T2 · sagittal · 3.5mm · 0.62mm/px · 1 of 35 slices shown (2 of 3)]
[im 1/35]
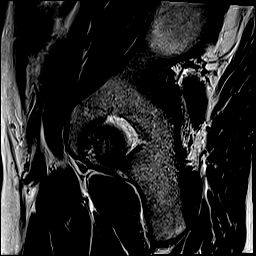

[Series 7: T1 · axial · 3.0mm · 0.35mm/px · 1 of 30 slices shown (2 of 46)]
[im 1/30]
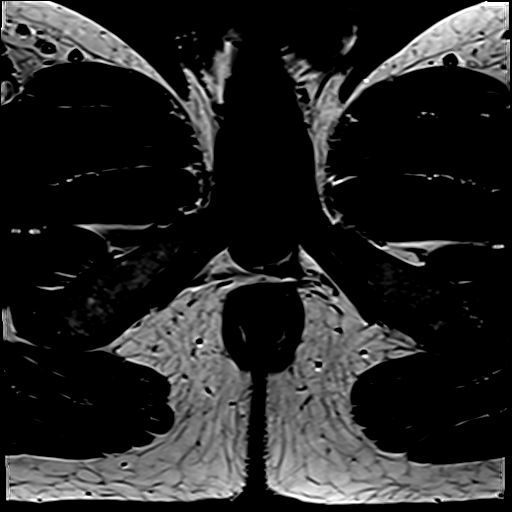

[Series 8: T2 · axial · 3.5mm · 0.56mm/px · 1 of 23 slices shown (3 of 3)]
[im 1/23]
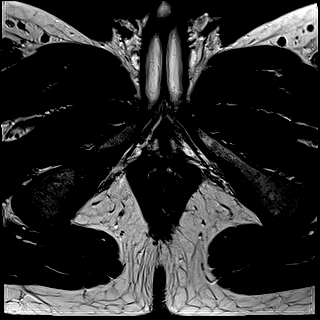

[Series 9: t2_space_tra (id) · axial · 1.0mm · 1.04mm/px · 1 of 80 slices shown]
[im 1/80]
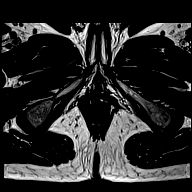

[Series 10: ax dwi_tracew · axial · 3.0mm · 0.78mm/px · 1 of 25 slices shown (1 of 3)]
[im 1/25]
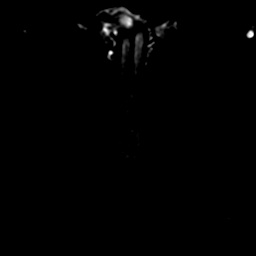

[Series 10: ax dwi_tracew · axial · 3.0mm · 0.78mm/px · 1 of 25 slices shown (2 of 3)]
[im 1/25]
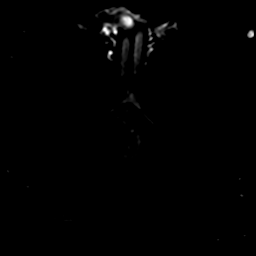

[Series 10: ax dwi_tracew · axial · 3.0mm · 0.78mm/px · 1 of 25 slices shown (3 of 3)]
[im 1/25]
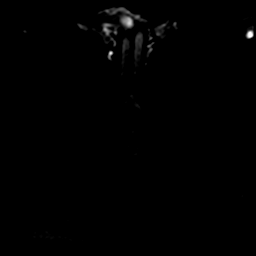

[Series 11: ax dwi_adc · axial · 3.0mm · 0.78mm/px · 1 of 25 slices shown]
[im 1/25]
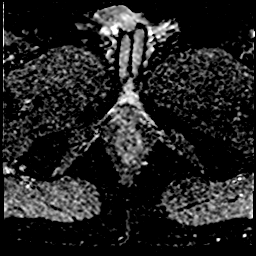

[Series 12: ax dwi_calc_bval · axial · 3.0mm · 0.78mm/px · 1 of 25 slices shown]
[im 1/25]
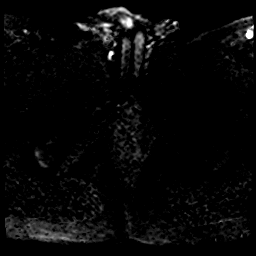

[Series 13: T1 · axial · 3.0mm · 1.15mm/px · 1 of 28 slices shown (3 of 46)]
[im 1/28]
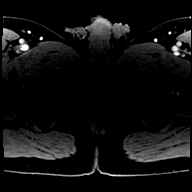

[Series 14: T1 · axial · 3.0mm · 1.15mm/px · 1 of 28 slices shown (4 of 46)]
[im 1/28]
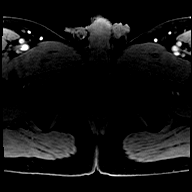

[Series 15: T1 · axial · 3.0mm · 1.15mm/px · 1 of 28 slices shown (5 of 46)]
[im 1/28]
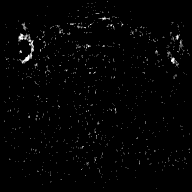

[Series 16: T1 · axial · 3.0mm · 1.15mm/px · 1 of 28 slices shown (6 of 46)]
[im 1/28]
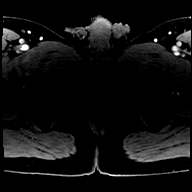

[Series 17: T1 · axial · 3.0mm · 1.15mm/px · 1 of 28 slices shown (7 of 46)]
[im 1/28]
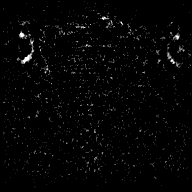

[Series 18: T1 · axial · 3.0mm · 1.15mm/px · 1 of 28 slices shown (8 of 46)]
[im 1/28]
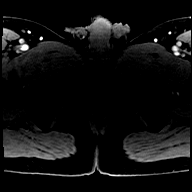

[Series 19: T1 · axial · 3.0mm · 1.15mm/px · 1 of 28 slices shown (9 of 46)]
[im 1/28]
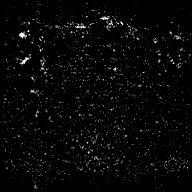

[Series 20: T1 · axial · 3.0mm · 1.15mm/px · 1 of 28 slices shown (10 of 46)]
[im 1/28]
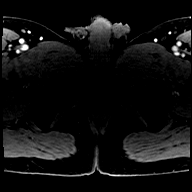

[Series 21: T1 · axial · 3.0mm · 1.15mm/px · 1 of 28 slices shown (11 of 46)]
[im 1/28]
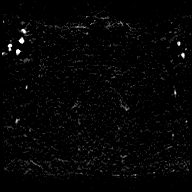

[Series 22: T1 · axial · 3.0mm · 1.15mm/px · 1 of 28 slices shown (12 of 46)]
[im 1/28]
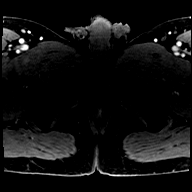

[Series 23: T1 · axial · 3.0mm · 1.15mm/px · 1 of 28 slices shown (13 of 46)]
[im 1/28]
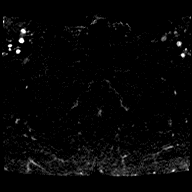

[Series 24: T1 · axial · 3.0mm · 1.15mm/px · 1 of 28 slices shown (14 of 46)]
[im 1/28]
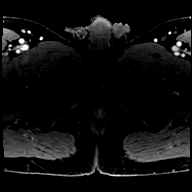

[Series 25: T1 · axial · 3.0mm · 1.15mm/px · 1 of 28 slices shown (15 of 46)]
[im 1/28]
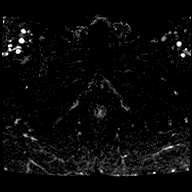

[Series 26: T1 · axial · 3.0mm · 1.15mm/px · 1 of 28 slices shown (16 of 46)]
[im 1/28]
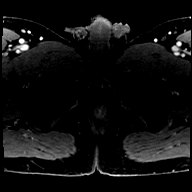

[Series 27: T1 · axial · 3.0mm · 1.15mm/px · 1 of 28 slices shown (17 of 46)]
[im 1/28]
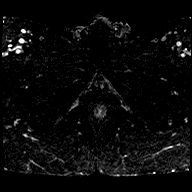

[Series 28: T1 · axial · 3.0mm · 1.15mm/px · 1 of 28 slices shown (18 of 46)]
[im 1/28]
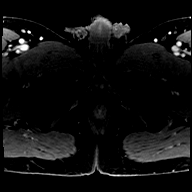

[Series 29: T1 · axial · 3.0mm · 1.15mm/px · 1 of 28 slices shown (19 of 46)]
[im 1/28]
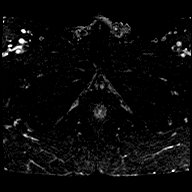

[Series 30: T1 · axial · 3.0mm · 1.15mm/px · 1 of 28 slices shown (20 of 46)]
[im 1/28]
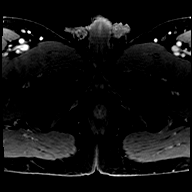

[Series 31: T1 · axial · 3.0mm · 1.15mm/px · 1 of 28 slices shown (21 of 46)]
[im 1/28]
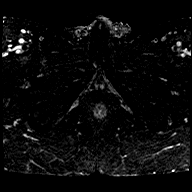

[Series 32: T1 · axial · 3.0mm · 1.15mm/px · 1 of 28 slices shown (22 of 46)]
[im 1/28]
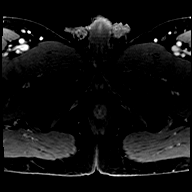

[Series 33: T1 · axial · 3.0mm · 1.15mm/px · 1 of 28 slices shown (23 of 46)]
[im 1/28]
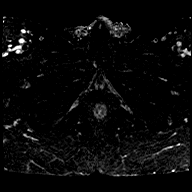

[Series 34: T1 · axial · 3.0mm · 1.15mm/px · 1 of 28 slices shown (24 of 46)]
[im 1/28]
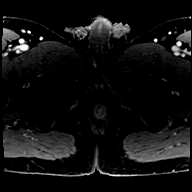

[Series 35: T1 · axial · 3.0mm · 1.15mm/px · 1 of 28 slices shown (25 of 46)]
[im 1/28]
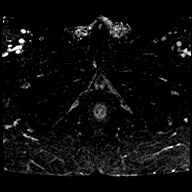

[Series 36: T1 · axial · 3.0mm · 1.15mm/px · 1 of 28 slices shown (26 of 46)]
[im 1/28]
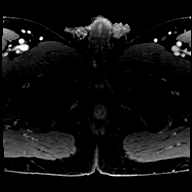

[Series 37: T1 · axial · 3.0mm · 1.15mm/px · 1 of 28 slices shown (27 of 46)]
[im 1/28]
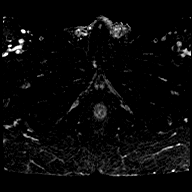

[Series 38: T1 · axial · 3.0mm · 1.15mm/px · 1 of 28 slices shown (28 of 46)]
[im 1/28]
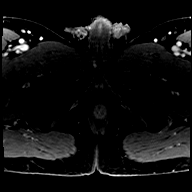

[Series 39: T1 · axial · 3.0mm · 1.15mm/px · 1 of 28 slices shown (29 of 46)]
[im 1/28]
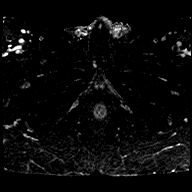

[Series 40: T1 · axial · 3.0mm · 1.15mm/px · 1 of 28 slices shown (30 of 46)]
[im 1/28]
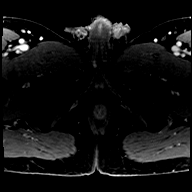

[Series 41: T1 · axial · 3.0mm · 1.15mm/px · 1 of 28 slices shown (31 of 46)]
[im 1/28]
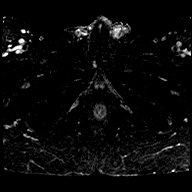

[Series 42: T1 · axial · 3.0mm · 1.15mm/px · 1 of 28 slices shown (32 of 46)]
[im 1/28]
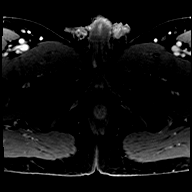

[Series 43: T1 · axial · 3.0mm · 1.15mm/px · 1 of 28 slices shown (33 of 46)]
[im 1/28]
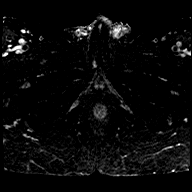

[Series 44: T1 · axial · 3.0mm · 1.15mm/px · 1 of 28 slices shown (34 of 46)]
[im 1/28]
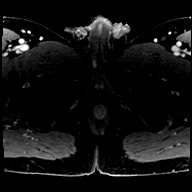

[Series 45: T1 · axial · 3.0mm · 1.15mm/px · 1 of 28 slices shown (35 of 46)]
[im 1/28]
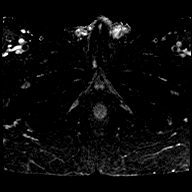

[Series 46: T1 · axial · 3.0mm · 1.15mm/px · 1 of 28 slices shown (36 of 46)]
[im 1/28]
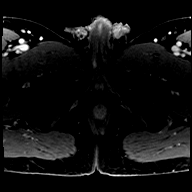

[Series 47: T1 · axial · 3.0mm · 1.15mm/px · 1 of 28 slices shown (37 of 46)]
[im 1/28]
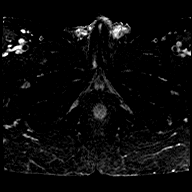

[Series 48: T1 · axial · 3.0mm · 1.15mm/px · 1 of 28 slices shown (38 of 46)]
[im 1/28]
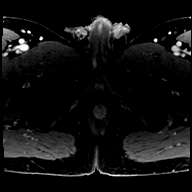

[Series 49: T1 · axial · 3.0mm · 1.15mm/px · 1 of 28 slices shown (39 of 46)]
[im 1/28]
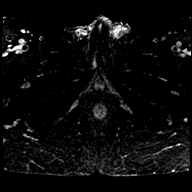

[Series 50: T1 · axial · 3.0mm · 1.15mm/px · 1 of 28 slices shown (40 of 46)]
[im 1/28]
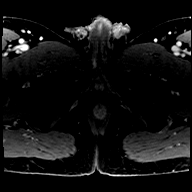

[Series 51: T1 · axial · 3.0mm · 1.15mm/px · 1 of 28 slices shown (41 of 46)]
[im 1/28]
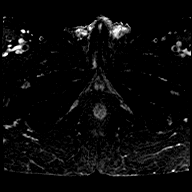

[Series 52: T1 · axial · 3.0mm · 1.15mm/px · 1 of 28 slices shown (42 of 46)]
[im 1/28]
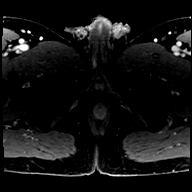

[Series 53: T1 · axial · 3.0mm · 1.15mm/px · 1 of 28 slices shown (43 of 46)]
[im 1/28]
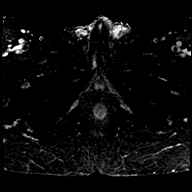

[Series 54: T1 · axial · 3.0mm · 1.15mm/px · 1 of 28 slices shown (44 of 46)]
[im 1/28]
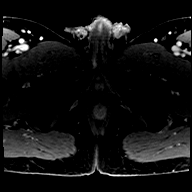

[Series 55: T1 · axial · 3.0mm · 1.15mm/px · 1 of 28 slices shown (45 of 46)]
[im 1/28]
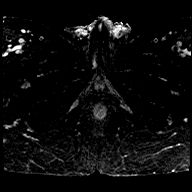

[Series 56: T1 · axial · 3.0mm · 1.15mm/px · 1 of 28 slices shown (46 of 46)]
[im 1/28]
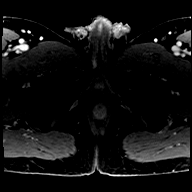

[56 of 56 positions shown; findings below may reference images not displayed]

FINDINGS: Prostate: Generalized hazy reduced T2 signal throughout most of the
peripheral zone of the prostate gland bilaterally associated early
enhancement in this region, as well as some mildly accentuated
restriction of diffusion, but without a focal lesion/focal region of
interest identified to further favor prostate cancer.

Volume: 3D volumetric analysis: Prostate measures 4.1 by 3.2 by
cm (22.26 cubic cm).

Transcapsular spread:  Absent

Seminal vesicle involvement: Absent

Neurovascular bundle involvement: Absent

Pelvic adenopathy: Absent

Bone metastasis: Absent

Other findings: Trace free pelvic fluid, uncertain significance.
Focal oval-shaped low T2 signal along this small amount of free
pelvic fluid on image [DATE], without appreciable enhancement and
somewhat high in T1 signal, possibly from some mild dystrophic
calcification.
IMPRESSION: 1. Diffuse low T2 signal throughout the peripheral zone of the
prostate gland bilaterally with associated enhancement and mildly
restricted diffusion. No specific focal lesion is observed to
provide a region of interest or focus of high suspicion. Given the
diffuse nature of the findings, this could represent residua from
prostatitis but is not entirely specific.
2. Small amount of free pelvic fluid, with slight nonenhancing
nodularity along the free pelvic fluid, significance uncertain.

## 2021-02-10 ENCOUNTER — Other Ambulatory Visit: Payer: BC Managed Care – PPO

## 2021-02-12 ENCOUNTER — Ambulatory Visit: Payer: BC Managed Care – PPO | Admitting: Urology

## 2021-05-30 DIAGNOSIS — J019 Acute sinusitis, unspecified: Secondary | ICD-10-CM | POA: Diagnosis not present

## 2021-05-30 DIAGNOSIS — Z20828 Contact with and (suspected) exposure to other viral communicable diseases: Secondary | ICD-10-CM | POA: Diagnosis not present

## 2022-01-17 DIAGNOSIS — M5412 Radiculopathy, cervical region: Secondary | ICD-10-CM | POA: Diagnosis not present

## 2022-01-17 DIAGNOSIS — M542 Cervicalgia: Secondary | ICD-10-CM | POA: Diagnosis not present

## 2022-02-01 DIAGNOSIS — M542 Cervicalgia: Secondary | ICD-10-CM | POA: Diagnosis not present

## 2022-02-21 DIAGNOSIS — M5412 Radiculopathy, cervical region: Secondary | ICD-10-CM | POA: Diagnosis not present

## 2022-03-16 DIAGNOSIS — T148XXA Other injury of unspecified body region, initial encounter: Secondary | ICD-10-CM | POA: Diagnosis not present

## 2022-03-16 DIAGNOSIS — L03119 Cellulitis of unspecified part of limb: Secondary | ICD-10-CM | POA: Diagnosis not present

## 2022-03-17 DIAGNOSIS — L0291 Cutaneous abscess, unspecified: Secondary | ICD-10-CM | POA: Diagnosis not present

## 2022-03-18 DIAGNOSIS — L03119 Cellulitis of unspecified part of limb: Secondary | ICD-10-CM | POA: Diagnosis not present

## 2022-03-19 DIAGNOSIS — R059 Cough, unspecified: Secondary | ICD-10-CM | POA: Diagnosis not present

## 2022-03-19 DIAGNOSIS — L03115 Cellulitis of right lower limb: Secondary | ICD-10-CM | POA: Diagnosis not present

## 2022-03-19 DIAGNOSIS — J069 Acute upper respiratory infection, unspecified: Secondary | ICD-10-CM | POA: Diagnosis not present
